# Patient Record
Sex: Female | Born: 1965 | Race: White | Hispanic: No | Smoking: Current every day smoker
Health system: Southern US, Community
[De-identification: ages and names within clinical notes are randomized; demographics above are authoritative.]

## PROBLEM LIST (undated history)

## (undated) DIAGNOSIS — E7800 Pure hypercholesterolemia, unspecified: Secondary | ICD-10-CM

## (undated) DIAGNOSIS — J189 Pneumonia, unspecified organism: Secondary | ICD-10-CM

## (undated) DIAGNOSIS — M549 Dorsalgia, unspecified: Secondary | ICD-10-CM

## (undated) DIAGNOSIS — F329 Major depressive disorder, single episode, unspecified: Secondary | ICD-10-CM

## (undated) DIAGNOSIS — F418 Other specified anxiety disorders: Secondary | ICD-10-CM

## (undated) DIAGNOSIS — J9811 Atelectasis: Secondary | ICD-10-CM

## (undated) DIAGNOSIS — G8929 Other chronic pain: Secondary | ICD-10-CM

## (undated) DIAGNOSIS — I10 Essential (primary) hypertension: Secondary | ICD-10-CM

## (undated) DIAGNOSIS — M1990 Unspecified osteoarthritis, unspecified site: Secondary | ICD-10-CM

## (undated) DIAGNOSIS — J439 Emphysema, unspecified: Secondary | ICD-10-CM

## (undated) DIAGNOSIS — J45909 Unspecified asthma, uncomplicated: Secondary | ICD-10-CM

## (undated) DIAGNOSIS — G4730 Sleep apnea, unspecified: Secondary | ICD-10-CM

## (undated) DIAGNOSIS — K219 Gastro-esophageal reflux disease without esophagitis: Secondary | ICD-10-CM

## (undated) DIAGNOSIS — G4700 Insomnia, unspecified: Secondary | ICD-10-CM

## (undated) DIAGNOSIS — R51 Headache: Secondary | ICD-10-CM

## (undated) DIAGNOSIS — F32A Depression, unspecified: Secondary | ICD-10-CM

## (undated) DIAGNOSIS — R0600 Dyspnea, unspecified: Secondary | ICD-10-CM

## (undated) DIAGNOSIS — M797 Fibromyalgia: Secondary | ICD-10-CM

## (undated) DIAGNOSIS — J449 Chronic obstructive pulmonary disease, unspecified: Secondary | ICD-10-CM

## (undated) DIAGNOSIS — Z9981 Dependence on supplemental oxygen: Secondary | ICD-10-CM

## (undated) DIAGNOSIS — I509 Heart failure, unspecified: Secondary | ICD-10-CM

## (undated) HISTORY — DX: Essential (primary) hypertension: I10

## (undated) HISTORY — DX: Other specified anxiety disorders: F41.8

## (undated) HISTORY — PX: GALLBLADDER SURGERY: SHX652

## (undated) HISTORY — PX: FOOT SURGERY: SHX648

## (undated) HISTORY — DX: Morbid (severe) obesity due to excess calories: E66.01

## (undated) HISTORY — PX: CHOLECYSTECTOMY: SHX55

## (undated) HISTORY — DX: Gastro-esophageal reflux disease without esophagitis: K21.9

## (undated) HISTORY — PX: TONSILLECTOMY: SUR1361

## (undated) HISTORY — DX: Dorsalgia, unspecified: M54.9

## (undated) HISTORY — DX: Headache: R51

## (undated) HISTORY — DX: Unspecified asthma, uncomplicated: J45.909

## (undated) HISTORY — DX: Pure hypercholesterolemia, unspecified: E78.00

## (undated) HISTORY — DX: Insomnia, unspecified: G47.00

---

## 2002-05-20 ENCOUNTER — Ambulatory Visit (HOSPITAL_BASED_OUTPATIENT_CLINIC_OR_DEPARTMENT_OTHER): Admission: RE | Admit: 2002-05-20 | Discharge: 2002-05-20 | Payer: Self-pay | Admitting: Neurosurgery

## 2006-01-15 ENCOUNTER — Ambulatory Visit: Payer: Self-pay | Admitting: Cardiology

## 2008-07-14 ENCOUNTER — Ambulatory Visit: Admission: RE | Admit: 2008-07-14 | Discharge: 2008-07-14 | Payer: Self-pay | Admitting: Neurology

## 2009-10-26 ENCOUNTER — Encounter
Admission: RE | Admit: 2009-10-26 | Discharge: 2009-10-26 | Payer: Self-pay | Admitting: Physical Medicine & Rehabilitation

## 2010-02-01 ENCOUNTER — Encounter: Admission: RE | Admit: 2010-02-01 | Discharge: 2010-02-01 | Payer: Self-pay | Admitting: *Deleted

## 2010-02-01 ENCOUNTER — Ambulatory Visit: Payer: Self-pay | Admitting: Physical Medicine & Rehabilitation

## 2010-02-18 ENCOUNTER — Emergency Department (HOSPITAL_COMMUNITY): Admission: EM | Admit: 2010-02-18 | Discharge: 2010-02-18 | Payer: Self-pay | Admitting: Emergency Medicine

## 2010-02-19 ENCOUNTER — Encounter
Admission: RE | Admit: 2010-02-19 | Discharge: 2010-02-19 | Payer: Self-pay | Admitting: Physical Medicine & Rehabilitation

## 2010-04-11 ENCOUNTER — Ambulatory Visit (HOSPITAL_COMMUNITY): Admission: RE | Admit: 2010-04-11 | Discharge: 2010-04-11 | Payer: Self-pay | Admitting: Obstetrics and Gynecology

## 2010-09-16 ENCOUNTER — Encounter: Payer: Self-pay | Admitting: Neurology

## 2011-01-07 NOTE — Procedures (Signed)
[name], [name]               ACCOUNT NO.:  [id number]   MEDICAL RECORD NO.:  [id number]          PATIENT TYPE:  OUT   LOCATION:  SLEE                          FACILITY:  [facility]   PHYSICIAN:  Kofi A. Gerilyn Pilgrim, M.D. DATE OF BIRTH:  1966-05-14   DATE OF PROCEDURE:  07/14/2008  DATE OF DISCHARGE:  07/14/2008                             SLEEP DISORDER REPORT   INDICATIONS FOR PROCEDURE:  A 45 year old lady who presents with  hypersomnia, snoring and is being evaluated at this time for obstructive  sleep apnea syndrome.   MEDICATIONS:  Requip, albuterol, Xanax, Altace, Flexeril, Daypro,  Percocet, Effexor, triamcinolone, Flonase, Zocor, Zyrtec, Aciphex and  Detrol.   EPWORTH SLEEPINESS SCALE:  1. BMI 41.   ARCHITECTURAL SUMMARY:  This is a nocturnal polysomnogram report.  The  total recording time is 378 minutes.  The sleep efficiency is 85%.  Sleep latency 10 minutes, REM latency 264 minutes.  Stage N1 15%, N2  69%, N3 9% and REM sleep 7%.   RESPIRATORY SUMMARY:  Baseline oxygen saturation 98%.  Lowest saturation  88%.  AHI 0.5.   LIMB MOVEMENT SUMMARY:  Periodic limb index 53.   ELECTROCARDIOGRAM SUMMARY:  Average heart rate 69 with no significant  dysrhythmia's observed.   IMPRESSION:  Severe periodic limb movement disorder of sleep.      Kofi A. Gerilyn Pilgrim, M.D.  Electronically Signed     KAD/MEDQ  D:  07/24/2008  T:  07/24/2008  Job:  [id number]

## 2012-11-01 ENCOUNTER — Other Ambulatory Visit: Payer: Self-pay | Admitting: Anesthesiology

## 2012-11-01 DIAGNOSIS — M47817 Spondylosis without myelopathy or radiculopathy, lumbosacral region: Secondary | ICD-10-CM

## 2012-11-01 DIAGNOSIS — M47812 Spondylosis without myelopathy or radiculopathy, cervical region: Secondary | ICD-10-CM

## 2012-11-01 DIAGNOSIS — M5137 Other intervertebral disc degeneration, lumbosacral region: Secondary | ICD-10-CM

## 2012-11-09 ENCOUNTER — Other Ambulatory Visit: Payer: Self-pay

## 2012-11-23 ENCOUNTER — Other Ambulatory Visit: Payer: Self-pay

## 2013-06-21 ENCOUNTER — Encounter (INDEPENDENT_AMBULATORY_CARE_PROVIDER_SITE_OTHER): Payer: Self-pay

## 2013-06-21 ENCOUNTER — Ambulatory Visit (INDEPENDENT_AMBULATORY_CARE_PROVIDER_SITE_OTHER): Payer: Medicaid Other | Admitting: Neurology

## 2013-06-21 ENCOUNTER — Encounter: Payer: Self-pay | Admitting: Neurology

## 2013-06-21 VITALS — BP 164/93 | HR 83 | Temp 98.2°F | Ht 65.0 in | Wt 339.0 lb

## 2013-06-21 DIAGNOSIS — M542 Cervicalgia: Secondary | ICD-10-CM | POA: Insufficient documentation

## 2013-06-21 DIAGNOSIS — G43019 Migraine without aura, intractable, without status migrainosus: Secondary | ICD-10-CM | POA: Insufficient documentation

## 2013-06-21 MED ORDER — ELETRIPTAN HYDROBROMIDE 40 MG PO TABS: 40.0000 mg | ORAL_TABLET | ORAL | Status: DC | PRN

## 2013-06-21 NOTE — Patient Instructions (Signed)
She was advised to try Relpax 40 mg for symptomatic relief of migraine and may repeat a second dose in one hour if suboptimal relief. Maximum 2 doses a day and maximum 2 days per week. Continue Topamax 200 mg daily for headache prevention and increase Zanaflex to 4 mg 1 tablet in the morning one at movement 2 tablets at night. I advised her to do neck stretching exercises and to maintain a headache diary. Check MRI scan of the brain with and without contrast an MRI scan of the cervical spine. Return for followup in 6 weeks with Larita Fife, nurse. practitioner call earlier if necessary.

## 2013-06-21 NOTE — Progress Notes (Signed)
[facility] [address] [place]. [place] [address] [phone]       OFFICE CONSULT NOTE  Ms. Aliany Fiorenza Date of Birth:  1966/01/13 Medical Record Number:  [id number]   Referring MD:  Doreen Beam  Reason for Referral:  headaches  HPI: Ms Ramberg is a 40 year Caucasian lady who is having migraine headaches most of her life. These seem to have gotten worse over the last 4 years. The headaches are stereotypical and began with a sharp pain over the left frontal region which  quickly grows to involve the left temple as well as the back of the head. This is accompanied by nausea, dizziness. She is in severe pain and cannot drive and has to rest and in fact is crying often. She in fact has to go to the emergency room for short-term relief with injections that it often comes back. She has been on Topamax 200 mg daily for headache prophylaxis the last 2 years and she has recently started Zanaflex 4 mg he tends daily for the last 4 months mainly for back pain and spasms. She has not tried amitriptyline or Inderal. The headache will last several hours to days. She states that she has not tried Imitrex or any other tip can't medications. She has about 8 headache days per month. She takes over-the-counter analgesics like Tylenol which did not help. She has been started on Percocet recently for shoulder and back pain which seems to help her headache a little. She has history of degenerative spine disease and at times has severe back pain intractable leg as well. She does have a strong family history of migraine in mother, sister and daughter. She states she had a CT scan of the head done a year ago at [facility] which was unremarkable. She has never had an MRI done. Is not able to for specific triggers for headaches but does find some relief when sleeping and lying down and not moving. Denies visual symptoms  with her headaches or focal neurological symptoms accompanying her  headaches.  ROS:   14 system review of systems is positive for fatigue, palpitations, chest pain, leg swelling, ringing in the ears, blurred vision and loss of vision, eye pain, shortness of breath, cough, wheezing, feeling cold and thirst, easy bruising, memory loss, confusion, headache, numbness, weakness, dizziness, anxiety, depression, not enough sleep, decreased energy, change in appetite, and his interest in activities, recent past, insomnia, sleepiness, snoring and restless legs. PMH:  Past Medical History  Diagnosis Date  . Headache(784.0)   . Depression with anxiety     Social History:  History   Social History  . Marital Status: Married    Spouse Name: N/A    Number of Children: 5  . Years of Education: 9th    Occupational History  . Not on file.   Social History Main Topics  . Smoking status: Current Every Day Smoker  . Smokeless tobacco: Not on file  . Alcohol Use: Yes     Comment: sometimes  . Drug Use: No  . Sexual Activity: Yes   Other Topics Concern  . Not on file   Social History Narrative  . No narrative on file    Medications:   No current outpatient prescriptions on file prior to visit.   No current facility-administered medications on file prior to visit.    Allergies:   Allergies  Allergen Reactions  . Morphine And Related   . Advera [Alitraq]  Physical Exam General: Morbidly obese middle-aged Caucasian lady, seated, in no evident distress Head: head normocephalic and atraumatic. Orohparynx benign Neck: supple with no carotid or supraclavicular bruits Cardiovascular: regular rate and rhythm, no murmurs Musculoskeletal: no deformity. Mild spasm of posterior neck muscles with tender points over the shoulder blades. Skin:  no rash/petichiae Vascular:  Normal pulses all extremities Filed Vitals:   06/21/13 1323  BP: 164/93  Pulse: 83  Temp: 98.2 F (36.8 C)    Neurologic Exam Mental Status: Awake and fully alert. Oriented to  place and time. Recent and remote memory intact. Attention span, concentration and fund of knowledge appropriate. Mood and affect appropriate.  Cranial Nerves: Fundoscopic exam reveals sharp disc margins. Pupils equal, briskly reactive to light. Extraocular movements full without nystagmus. Visual fields full to confrontation. Hearing intact. Facial sensation intact. Face, tongue, palate moves normally and symmetrically.  Motor: Normal bulk and tone. Normal strength in all tested extremity muscles. Sensory.: intact to touch and pinprick and vibratory.  Coordination: Rapid alternating movements normal in all extremities. Finger-to-nose and heel-to-shin performed accurately bilaterally. Gait and Station: Arises from chair without difficulty. Stance is normal. Gait demonstrates normal stride length and balance . Able to heel, toe and tandem walk without difficulty.  Reflexes:  Depressed and symmetric. Toes downgoing.     ASSESSMENT: 89 year lady with long-standing history of migraines with gradual worsening over the years    PLAN: I had a long discussion with the patient about her headaches, triggers, relieving factors, plan for evaluation and treatment and answered questions. She was advised to try Relpax 40 mg for symptomatic relief of migraine and may repeat a second dose in one hour if suboptimal relief. Maximum 2 doses a day and maximum 2 days per week. Continue Topamax 200 mg daily for headache prevention and increase Zanaflex to 4 mg 1 tablet in the morning one at movement 2 tablets at night. I advised her to do neck stretching exercises and to maintain a headache diary. Check MRI scan of the brain with and without contrast an MRI scan of the cervical spine. Return for followup in 6 weeks with Larita Fife, nurse. practitioner call earlier if necessary.

## 2013-06-29 ENCOUNTER — Other Ambulatory Visit: Payer: Self-pay | Admitting: Orthopaedic Surgery

## 2013-06-29 DIAGNOSIS — M7731 Calcaneal spur, right foot: Secondary | ICD-10-CM

## 2013-06-29 DIAGNOSIS — M79671 Pain in right foot: Secondary | ICD-10-CM

## 2013-07-12 ENCOUNTER — Other Ambulatory Visit: Payer: Self-pay

## 2013-08-05 ENCOUNTER — Encounter: Payer: Self-pay | Admitting: Nurse Practitioner

## 2013-08-05 ENCOUNTER — Encounter (INDEPENDENT_AMBULATORY_CARE_PROVIDER_SITE_OTHER): Payer: Self-pay

## 2013-08-05 ENCOUNTER — Ambulatory Visit (INDEPENDENT_AMBULATORY_CARE_PROVIDER_SITE_OTHER): Payer: Medicaid Other | Admitting: Nurse Practitioner

## 2013-08-05 VITALS — BP 125/80 | HR 76 | Temp 98.2°F | Ht 65.0 in | Wt 339.0 lb

## 2013-08-05 DIAGNOSIS — G43019 Migraine without aura, intractable, without status migrainosus: Secondary | ICD-10-CM

## 2013-08-05 DIAGNOSIS — R112 Nausea with vomiting, unspecified: Secondary | ICD-10-CM

## 2013-08-05 DIAGNOSIS — R519 Headache, unspecified: Secondary | ICD-10-CM

## 2013-08-05 DIAGNOSIS — R51 Headache: Secondary | ICD-10-CM

## 2013-08-05 MED ORDER — TOPIRAMATE 200 MG PO TABS: 100.0000 mg | ORAL_TABLET | Freq: Every day | ORAL | Status: DC

## 2013-08-05 MED ORDER — TRAMADOL HCL 50 MG PO TABS: 100.0000 mg | ORAL_TABLET | Freq: Four times a day (QID) | ORAL | Status: DC | PRN

## 2013-08-05 MED ORDER — TIZANIDINE HCL 6 MG PO CAPS: 6.0000 mg | ORAL_CAPSULE | Freq: Three times a day (TID) | ORAL | Status: DC

## 2013-08-05 MED ORDER — TOPIRAMATE 100 MG PO TABS: 100.0000 mg | ORAL_TABLET | Freq: Every day | ORAL | Status: DC

## 2013-08-05 NOTE — Patient Instructions (Signed)
Increase Tizanidine to 6mg  every 6 hours as headache prevention.  Do neck stretching exercises and Relaxation exercises to relieve stress.  Stop Smoking.  This is likely making your headaches worse.  Tramadol 100 mg every 6 hours as needed for moderate to severe headache.  I have ordered a CT of the head, if it is approved, someone will call to set this up.  Follow up in 3 months.

## 2013-08-05 NOTE — Progress Notes (Signed)
PATIENT: Marissa [name] DOB: 05-06-1966   REASON FOR VISIT: follow up for Migraine HISTORY FROM: patient  HISTORY OF PRESENT ILLNESS: 06/21/13 (PS): Marissa [name] is a 23 year Caucasian lady who is having migraine headaches most of her life. These seem to have gotten worse over the last 4 years. The headaches are stereotypical and began with a sharp pain over the left frontal region which quickly grows to involve the left temple as well as the back of the head. This is accompanied by nausea, dizziness. She is in severe pain and cannot drive and has to rest and in fact is crying often. She in fact has to go to the emergency room for short-term relief with injections that it often comes back. She has been on Topamax 200 mg daily for headache prophylaxis the last 2 years and she has recently started Zanaflex 4 mg she takes daily for the last 4 months mainly for back pain and spasms. She has not tried amitriptyline or Inderal. The headache will last several hours to days. She states that she has not tried Imitrex or any other acute Migraine medications. She has about 8 headache days per month. She takes over-the-counter analgesics like Tylenol which did not help. She has been started on Percocet recently for shoulder and back pain which seems to help her headache a little. She has history of degenerative spine disease and at times has severe back pain intractable leg as well. She does have a strong family history of migraine in mother, sister and daughter. She has never had an MRI done. Is not able to for specific triggers for headaches but does find some relief when sleeping and lying down and not moving. Denies visual symptoms with her headaches or focal neurological symptoms accompanying her headaches.   08/05/13 (LL): Marissa. [name] returns for revisit for headaches.  She states her headaches are worsening, she has them nearly every day, worse early in the morning, right-sided, with nausea and vomiting sometimes.   No associated aura.  She states increase in Topamax has not helped, neither has Tizanidine had any effect.  She has taken Tramadol in the past which did not help.  The only thing she states has helped was Percocet.  She states she is trying to stop smoking.  She reports a bad reaction to Relpax; with all over weakness, shortness of breath, and tightness in her chest.    ROS:  14 system review of systems is positive for  memory loss, headache, numbness, weakness, dizziness, not enough sleep, insomnia, sleepiness, and restless legs.   ALLERGIES: Allergies  Allergen Reactions  . Morphine And Related   . Advera [Alitraq]   . Relpax [Eletriptan]     Weakness, couldn't move    HOME MEDICATIONS: Outpatient Prescriptions Prior to Visit  Medication Sig Dispense Refill  . ARIPiprazole (ABILIFY) 5 MG tablet Take 5 mg by mouth daily.      . Cetirizine HCl (ZYRTEC ALLERGY) 10 MG CAPS Take 10 mg by mouth.      . escitalopram (LEXAPRO) 20 MG tablet Take 20 mg by mouth daily.      Marland Kitchen oxyCODONE-acetaminophen (PERCOCET) 10-325 MG per tablet Take 1 tablet by mouth every 4 (four) hours as needed for pain (take twice a day as needed).      Marland Kitchen tiZANidine (ZANAFLEX) 4 MG capsule Take 4 mg by mouth 3 (three) times daily.      Marland Kitchen topiramate (TOPAMAX) 200 MG tablet Take 200 mg by mouth  daily.      . eletriptan (RELPAX) 40 MG tablet Take 1 tablet (40 mg total) by mouth as needed for migraine. One tablet by mouth at onset of headache. May repeat in 2 hours if headache persists or recurs.  10 tablet  0   No facility-administered medications prior to visit.    PAST MEDICAL HISTORY: Past Medical History  Diagnosis Date  . Headache(784.0)   . Depression with anxiety     PAST SURGICAL HISTORY: Past Surgical History  Procedure Laterality Date  . Cesarean section    . Gallbladder surgery    . Foot surgery      FAMILY HISTORY: Family History  Problem Relation Age of Onset  . Congestive Heart Failure Mother       SOCIAL HISTORY: History   Social History  . Marital Status: Divorced    Spouse Name: N/A    Number of Children: 5  . Years of Education: 9th    Occupational History  .  Other    n/a   Social History Main Topics  . Smoking status: Current Every Day Smoker  . Smokeless tobacco: Never Used  . Alcohol Use: Yes     Comment: sometimes  . Drug Use: No  . Sexual Activity: Yes   Other Topics Concern  . Not on file   Social History Narrative   Patient lives at home with family.   Caffeine Use: 1 cup daily   PHYSICAL EXAM  Filed Vitals:   08/05/13 1513  BP: 125/80  Pulse: 76  Temp: 98.2 F (36.8 C)  TempSrc: Oral  Height: 5\' 5"  (1.651 m)  Weight: 339 lb (153.769 kg)   Body mass index is 56.41 kg/(m^2).  General: Morbidly obese middle-aged Caucasian lady, seated, in no evident distress, strong smell of cigarettes.  Head: head normocephalic and atraumatic. Orohparynx benign  Neck: supple with no carotid or supraclavicular bruits  Cardiovascular: regular rate and rhythm, no murmurs  Musculoskeletal: no deformity. Mild spasm of posterior neck muscles with tender points over the shoulder blades.  Skin: no rash/petichiae  Vascular: Normal pulses all extremities  Neurological examination  Mental Status: Awake and fully alert. Oriented to place and time. Recent and remote memory intact. Attention span, concentration and fund of knowledge appropriate. Mood and affect appropriate.  Cranial Nerves: Fundoscopic exam reveals sharp disc margins. Pupils equal, briskly reactive to light. Extraocular movements full without nystagmus. Visual fields full to confrontation. Hearing intact. Facial sensation intact. Face, tongue, palate moves normally and symmetrically.  Motor: Normal bulk and tone. Normal strength in all tested extremity muscles.  Sensor: intact to touch and pinprick and vibratory.  Coordination: Rapid alternating movements normal in all extremities. Finger-to-nose and  heel-to-shin performed accurately bilaterally.  Gait and Station: Arises from chair with mild difficulty. Stance is wide based. Gait demonstrates short stride length. Unable to heel, toe and tandem walk without difficulty. Reflexes: Depressed and symmetric.   DIAGNOSTIC DATA (LABS, IMAGING, TESTING) - I reviewed patient records, labs, notes, testing and imaging myself where available.  ASSESSMENT AND PLAN 49 year lady with long-standing history of migraines without aura with progressive worsening.  Occasional nausea and vomiting associated with headaches.  PLAN:  I had a long discussion with the patient about her headaches, triggers, relieving factors, plan for evaluation and treatment and answered questions.  We do not prescribe narcotics for Migraines. Reduce Topamax to 100 mg daily for headache prevention, with plan to wean off -- not beneficial. Increase Tizanidine to 6mg   every 6 hours as headache prevention. Do neck stretching exercises and Relaxation exercises to relieve stress. Stop Smoking.  This is likely making your headaches worse. I have ordered a CT of the head to rule out tumor/mass. Follow up in 3 months.  Orders Placed This Encounter  Procedures  . CT Head Wo Contrast   Meds ordered this encounter  Medications  . tizanidine (ZANAFLEX) 6 MG capsule    Sig: Take 1 capsule (6 mg total) by mouth 3 (three) times daily.    Dispense:  120 capsule    Refill:  5    Order Specific Question:  Supervising Provider    Answer:  Pearlean Brownie, PRAMOD S [[id number]]  . topiramate (TOPAMAX) 100 MG tablet    Sig: Take 0.5 tablets (100 mg total) by mouth daily.    Order Specific Question:  Supervising Provider    Answer:  Micki Riley [[id number]]   Return in about 3 months (around 11/03/2013).  Ronal Fear, MSN, NP-C 08/05/2013, 4:19 PM [facility] [address], [address] [place], [place] [address] [phone]  Note: This document was prepared with digital dictation and  possible smart phrase technology. Any transcriptional errors that result from this process are unintentional.

## 2013-08-16 ENCOUNTER — Other Ambulatory Visit: Payer: Self-pay

## 2013-09-01 ENCOUNTER — Other Ambulatory Visit: Payer: Self-pay

## 2013-09-01 ENCOUNTER — Ambulatory Visit
Admission: RE | Admit: 2013-09-01 | Discharge: 2013-09-01 | Disposition: A | Discharge status: Home / Self Care | Payer: Medicaid Other | Source: Ambulatory Visit | Attending: Nurse Practitioner | Admitting: Nurse Practitioner

## 2013-09-01 DIAGNOSIS — G43019 Migraine without aura, intractable, without status migrainosus: Secondary | ICD-10-CM

## 2013-09-01 DIAGNOSIS — R112 Nausea with vomiting, unspecified: Secondary | ICD-10-CM

## 2013-09-01 DIAGNOSIS — R51 Headache: Secondary | ICD-10-CM

## 2013-09-01 DIAGNOSIS — R519 Headache, unspecified: Secondary | ICD-10-CM

## 2013-09-15 ENCOUNTER — Ambulatory Visit: Payer: Medicaid Other | Admitting: Podiatry

## 2013-09-19 ENCOUNTER — Ambulatory Visit: Payer: Medicaid Other | Admitting: Podiatry

## 2013-09-27 ENCOUNTER — Encounter (HOSPITAL_COMMUNITY): Payer: Self-pay | Admitting: Emergency Medicine

## 2013-09-27 ENCOUNTER — Emergency Department (HOSPITAL_COMMUNITY): Payer: Medicaid Other

## 2013-09-27 ENCOUNTER — Emergency Department (HOSPITAL_COMMUNITY)
Admission: EM | Admit: 2013-09-27 | Discharge: 2013-09-27 | Disposition: A | Discharge status: Home / Self Care | Payer: Medicaid Other | Attending: Emergency Medicine | Admitting: Emergency Medicine

## 2013-09-27 DIAGNOSIS — F17200 Nicotine dependence, unspecified, uncomplicated: Secondary | ICD-10-CM | POA: Insufficient documentation

## 2013-09-27 DIAGNOSIS — R209 Unspecified disturbances of skin sensation: Secondary | ICD-10-CM | POA: Insufficient documentation

## 2013-09-27 DIAGNOSIS — M79609 Pain in unspecified limb: Secondary | ICD-10-CM | POA: Insufficient documentation

## 2013-09-27 DIAGNOSIS — F341 Dysthymic disorder: Secondary | ICD-10-CM | POA: Insufficient documentation

## 2013-09-27 DIAGNOSIS — J40 Bronchitis, not specified as acute or chronic: Secondary | ICD-10-CM

## 2013-09-27 DIAGNOSIS — M5416 Radiculopathy, lumbar region: Secondary | ICD-10-CM

## 2013-09-27 DIAGNOSIS — IMO0002 Reserved for concepts with insufficient information to code with codable children: Secondary | ICD-10-CM | POA: Insufficient documentation

## 2013-09-27 DIAGNOSIS — G8929 Other chronic pain: Secondary | ICD-10-CM | POA: Insufficient documentation

## 2013-09-27 DIAGNOSIS — Z79899 Other long term (current) drug therapy: Secondary | ICD-10-CM | POA: Insufficient documentation

## 2013-09-27 DIAGNOSIS — J209 Acute bronchitis, unspecified: Secondary | ICD-10-CM | POA: Insufficient documentation

## 2013-09-27 HISTORY — DX: Other chronic pain: G89.29

## 2013-09-27 HISTORY — DX: Dorsalgia, unspecified: M54.9

## 2013-09-27 LAB — BASIC METABOLIC PANEL
BUN: 8 mg/dL (ref 6–23)
CO2: 28 meq/L (ref 19–32)
Calcium: 9 mg/dL (ref 8.4–10.5)
Chloride: 101 mEq/L (ref 96–112)
Creatinine, Ser: 0.7 mg/dL (ref 0.50–1.10)
GLUCOSE: 112 mg/dL — AB (ref 70–99)
Potassium: 4.3 mEq/L (ref 3.7–5.3)
Sodium: 140 mEq/L (ref 137–147)

## 2013-09-27 LAB — CBC WITH DIFFERENTIAL/PLATELET
BASOS ABS: 0.1 10*3/uL (ref 0.0–0.1)
Basophils Relative: 1 % (ref 0–1)
EOS PCT: 2 % (ref 0–5)
Eosinophils Absolute: 0.2 10*3/uL (ref 0.0–0.7)
HEMATOCRIT: 43.7 % (ref 36.0–46.0)
HEMOGLOBIN: 14.3 g/dL (ref 12.0–15.0)
LYMPHS ABS: 1.8 10*3/uL (ref 0.7–4.0)
Lymphocytes Relative: 21 % (ref 12–46)
MCH: 33.4 pg (ref 26.0–34.0)
MCHC: 32.7 g/dL (ref 30.0–36.0)
MCV: 102.1 fL — AB (ref 78.0–100.0)
Monocytes Absolute: 0.5 10*3/uL (ref 0.1–1.0)
Monocytes Relative: 6 % (ref 3–12)
NEUTROS ABS: 6.2 10*3/uL (ref 1.7–7.7)
Neutrophils Relative %: 71 % (ref 43–77)
Platelets: 202 10*3/uL (ref 150–400)
RBC: 4.28 MIL/uL (ref 3.87–5.11)
RDW: 12.6 % (ref 11.5–15.5)
WBC: 8.8 10*3/uL (ref 4.0–10.5)

## 2013-09-27 LAB — PRO B NATRIURETIC PEPTIDE: PRO B NATRI PEPTIDE: 516.5 pg/mL — AB (ref 0–125)

## 2013-09-27 LAB — TROPONIN I: Troponin I: <0.3 ng/mL (ref <0.30)

## 2013-09-27 MED ORDER — OXYCODONE-ACETAMINOPHEN 5-325 MG PO TABS: 2.0000 | ORAL_TABLET | ORAL | Status: DC | PRN

## 2013-09-27 MED ORDER — PREDNISONE 20 MG PO TABS: ORAL_TABLET | ORAL | Status: DC

## 2013-09-27 MED ORDER — AZITHROMYCIN 250 MG PO TABS: 250.0000 mg | ORAL_TABLET | Freq: Every day | ORAL | Status: DC

## 2013-09-27 MED ORDER — FENTANYL CITRATE 0.05 MG/ML IJ SOLN: 100.0000 ug | Freq: Once | INTRAMUSCULAR | Status: DC

## 2013-09-27 MED ORDER — ALBUTEROL SULFATE HFA 108 (90 BASE) MCG/ACT IN AERS: 2.0000 | INHALATION_SPRAY | RESPIRATORY_TRACT | Status: DC | PRN

## 2013-09-27 NOTE — ED Notes (Signed)
Pt c/o pain in lower back radiating down r leg since Jan 29th.  Reports cough and sob with exertion x 2 days.  Denies chest pain.

## 2013-09-27 NOTE — ED Provider Notes (Signed)
CSN: [id number]     Arrival date & time 09/27/13  1507 History   This chart was scribed for Gilda Crease, MD, by Yevette Edwards, ED Scribe. This patient was seen in room [facility] and the patient's care was started at 3:38 PM.  First MD Initiated Contact with Patient 09/27/13 1530     Chief Complaint  Patient presents with  . Back Pain    The history is provided by the patient. No language interpreter was used.   HPI Comments: Marissa [name] is a 48 y.o. female, with a h/o chronic back pain, who presents to the Emergency Department complaining of  lower back pain which radiates down her leg into her right foot. She reports her numbness to her right foot stating this is a new symptom. The pt has finished her prescription of percocet, and she cannot have them refilled for three more days. The pt has experienced similar symptoms of back pain for multiple years; her last MRI was in 2006, 9 years ago. The pt also complains of SOB, congestion, and a cough; she reports she cannot cough normally due to back and leg pain. Marissa [name] is a current smoker.   Past Medical History  Diagnosis Date  . Headache(784.0)   . Depression with anxiety   . Chronic back pain    Past Surgical History  Procedure Laterality Date  . Cesarean section    . Gallbladder surgery    . Foot surgery    . Cholecystectomy     Family History  Problem Relation Age of Onset  . Congestive Heart Failure Mother    History  Substance Use Topics  . Smoking status: Current Every Day Smoker -- 1.00 packs/day for 35 years    Types: Cigarettes  . Smokeless tobacco: Never Used  . Alcohol Use: No   OB History   Grav Para Term Preterm Abortions TAB SAB Ect Mult Living   5 5 5       5      Review of Systems  HENT: Positive for congestion.   Respiratory: Positive for cough and shortness of breath.   All other systems reviewed and are negative.   Allergies  Morphine and related; Advera; Dilaudid; and Relpax  Home  Medications   Current Outpatient Rx  Name  Route  Sig  Dispense  Refill  . ARIPiprazole (ABILIFY) 5 MG tablet   Oral   Take 5 mg by mouth daily.         . Cetirizine HCl (ZYRTEC ALLERGY) 10 MG CAPS   Oral   Take 10 mg by mouth.         . escitalopram (LEXAPRO) 20 MG tablet   Oral   Take 20 mg by mouth daily.         Marland Kitchen oxyCODONE-acetaminophen (PERCOCET) 10-325 MG per tablet   Oral   Take 1 tablet by mouth every 4 (four) hours as needed for pain (take twice a day as needed).         . tizanidine (ZANAFLEX) 6 MG capsule   Oral   Take 1 capsule (6 mg total) by mouth 3 (three) times daily.   120 capsule   5   . topiramate (TOPAMAX) 100 MG tablet   Oral   Take 1 tablet (100 mg total) by mouth daily.          Triage Vitals: BP 151/99  Pulse 78  Temp(Src) 97.8 F (36.6 C) (Oral)  Resp 22  Ht 5\' 5"  (  1.651 m)  Wt 332 lb (150.594 kg)  BMI 55.25 kg/m2  SpO2 97%  LMP 09/13/2013  Physical Exam  Nursing note and vitals reviewed. Constitutional: She is oriented to person, place, and time. She appears well-developed and well-nourished. No distress.  HENT:  Head: Normocephalic and atraumatic.  Right Ear: Hearing normal.  Left Ear: Hearing normal.  Nose: Nose normal.  Mouth/Throat: Oropharynx is clear and moist and mucous membranes are normal.  Eyes: Conjunctivae and EOM are normal. Pupils are equal, round, and reactive to light.  Neck: Normal range of motion. Neck supple.  Cardiovascular: Regular rhythm, S1 normal and S2 normal.  Exam reveals no gallop and no friction rub.   No murmur heard. Pulmonary/Chest: Effort normal. No respiratory distress. She exhibits no tenderness.  Crackles at the base of both lungs.   Abdominal: Soft. Normal appearance and bowel sounds are normal. There is no hepatosplenomegaly. There is no tenderness. There is no rebound, no guarding, no tenderness at McBurney's point and negative Murphy's sign. No hernia.  Musculoskeletal: Normal range  of motion.  Neurological: She is alert and oriented to person, place, and time. She has normal strength. No cranial nerve deficit or sensory deficit. Coordination normal. GCS eye subscore is 4. GCS verbal subscore is 5. GCS motor subscore is 6.  Skin: Skin is warm, dry and intact. No rash noted. No cyanosis.  Psychiatric: She has a normal mood and affect. Her speech is normal and behavior is normal. Thought content normal.    ED Course  Procedures (including critical care time)  DIAGNOSTIC STUDIES: Oxygen Saturation is 97% on room air, normal by my interpretation.    COORDINATION OF CARE:  3:43 PM- Discussed treatment plan with patient, and the patient agreed to the plan.   Labs Review Labs Reviewed - No data to display Imaging Review No results found.  EKG Interpretation   None       Date: 09/27/2013  Rate: 73  Rhythm: normal sinus rhythm  QRS Axis: normal  Intervals: normal  ST/T Wave abnormalities: normal  Conduction Disutrbances:none  Narrative Interpretation:   Old EKG Reviewed: unchanged     MDM  Diagnosis: 1. Bronchitis 2. Lumbar radiculopathy  Patient presents to ER with complaints of low back pain. Patient has had chronic low back pain for some time. She reports that she has had worsening pain recently and had to take increased amounts of her Percocet, has now run out and cannot get anymore for 3 days. Patient reports pain radiating down the hallway to the foot on the right side. She has been diagnosed with "bulging discs" in the past. She appears to be in distress secondary to pain, but she has normal neurologic function here in the ER. No emergent imaging is necessary.  She also complains of cough, chest congestion. She has some slight crackles on examination, but there is no evidence of congestive heart failure. In retrospect, these were more consistent with upper airway congestion, because repeat examination reveals crackles are no longer present. Patient does  have a productive cough here in ER.  She'll be treated for acute bronchitis. She'll have prednisone added for the bronchitis as well as for the lumbar radiculopathy. Percocet as needed for pain. Patient is to followup with her primary care physician, Doctor VYAS as soon as possible for further management.  I personally performed the services described in this documentation, which was scribed in my presence. The recorded information has been reviewed and is accurate.  Gilda Crease, MD 09/27/13 [phone]

## 2013-09-27 NOTE — Discharge Instructions (Signed)
Bronchitis Bronchitis is inflammation of the airways that extend from the windpipe into the lungs (bronchi). The inflammation often causes mucus to develop, which leads to a cough. If the inflammation becomes severe, it may cause shortness of breath. CAUSES  Bronchitis may be caused by:   Viral infections.   Bacteria.   Cigarette smoke.   Allergens, pollutants, and other irritants.  SIGNS AND SYMPTOMS  The most common symptom of bronchitis is a frequent cough that produces mucus. Other symptoms include:  Fever.   Body aches.   Chest congestion.   Chills.   Shortness of breath.   Sore throat.  DIAGNOSIS  Bronchitis is usually diagnosed through a medical history and physical exam. Tests, such as chest X-rays, are sometimes done to rule out other conditions.  TREATMENT  You may need to avoid contact with whatever caused the problem (smoking, for example). Medicines are sometimes needed. These may include:  Antibiotics. These may be prescribed if the condition is caused by bacteria.  Cough suppressants. These may be prescribed for relief of cough symptoms.   Inhaled medicines. These may be prescribed to help open your airways and make it easier for you to breathe.   Steroid medicines. These may be prescribed for those with recurrent (chronic) bronchitis. HOME CARE INSTRUCTIONS  Get plenty of rest.   Drink enough fluids to keep your urine clear or pale yellow (unless you have a medical condition that requires fluid restriction). Increasing fluids may help thin your secretions and will prevent dehydration.   Only take over-the-counter or prescription medicines as directed by your health care provider.  Only take antibiotics as directed. Make sure you finish them even if you start to feel better.  Avoid secondhand smoke, irritating chemicals, and strong fumes. These will make bronchitis worse. If you are a smoker, quit smoking. Consider using nicotine gum or  skin patches to help control withdrawal symptoms. Quitting smoking will help your lungs heal faster.   Put a cool-mist humidifier in your bedroom at night to moisten the air. This may help loosen mucus. Change the water in the humidifier daily. You can also run the hot water in your shower and sit in the bathroom with the door closed for 5 10 minutes.   Follow up with your health care provider as directed.   Wash your hands frequently to avoid catching bronchitis again or spreading an infection to others.  SEEK MEDICAL CARE IF: Your symptoms do not improve after 1 week of treatment.  SEEK IMMEDIATE MEDICAL CARE IF:  Your fever increases.  You have chills.   You have chest pain.   You have worsening shortness of breath.   You have bloody sputum.  You faint.  You have lightheadedness.  You have a severe headache.   You vomit repeatedly. MAKE SURE YOU:   Understand these instructions.  Will watch your condition.  Will get help right away if you are not doing well or get worse. Document Released: 08/11/2005 Document Revised: 06/01/2013 Document Reviewed: 04/05/2013 [facility] Patient Information 2014 [place], [place].  Lumbosacral Radiculopathy Lumbosacral radiculopathy is a pinched nerve or nerves in the low back (lumbosacral area). When this happens you may have weakness in your legs and may not be able to stand on your toes. You may have pain going down into your legs. There may be difficulties with walking normally. There are many causes of this problem. Sometimes this may happen from an injury, or simply from arthritis or boney problems. It may also  be caused by other illnesses such as diabetes. If there is no improvement after treatment, further studies may be done to find the exact cause. DIAGNOSIS  X-rays may be needed if the problems become long standing. Electromyograms may be done. This study is one in which the working of nerves and muscles is studied. HOME  CARE INSTRUCTIONS   Applications of ice packs may be helpful. Ice can be used in a plastic bag with a towel around it to prevent frostbite to skin. This may be used every 2 hours for 20 to 30 minutes, or as needed, while awake, or as directed by your caregiver.  Only take over-the-counter or prescription medicines for pain, discomfort, or fever as directed by your caregiver.  If physical therapy was prescribed, follow your caregiver's directions. SEEK IMMEDIATE MEDICAL CARE IF:   You have pain not controlled with medications.  You seem to be getting worse rather than better.  You develop increasing weakness in your legs.  You develop loss of bowel or bladder control.  You have difficulty with walking or balance, or develop clumsiness in the use of your legs.  You have a fever. MAKE SURE YOU:   Understand these instructions.  Will watch your condition.  Will get help right away if you are not doing well or get worse. Document Released: 08/11/2005 Document Revised: 11/03/2011 Document Reviewed: 03/31/2008 [facility] Patient Information 2014 [place].

## 2013-09-27 NOTE — ED Notes (Signed)
Patient c/o severe lower back pain.Per patient has three bulging discs in lower back.  Patient reports out of pain medication -Percocet 10/325mg . Patient states unable to get pain medication filled until the 09/30/13. [facility] injection of dilaudid in which she had an allergic reaction. While being triaged patient also states "I think I have fluid built up on my lungs." Reports some shortness of breath. Patient states "When I cough it sounds like a gurgling sound."

## 2013-09-27 NOTE — ED Notes (Signed)
Unable to obtain IV access, Dr. Blinda LeatherwoodPollina aware.

## 2013-10-05 ENCOUNTER — Ambulatory Visit: Payer: Medicaid Other | Admitting: Podiatry

## 2013-10-27 ENCOUNTER — Other Ambulatory Visit: Payer: Self-pay | Admitting: Orthopaedic Surgery

## 2013-10-27 DIAGNOSIS — M545 Low back pain: Principal | ICD-10-CM

## 2013-10-27 DIAGNOSIS — G8929 Other chronic pain: Secondary | ICD-10-CM

## 2013-11-03 ENCOUNTER — Ambulatory Visit: Payer: Medicaid Other | Admitting: Podiatry

## 2013-11-03 ENCOUNTER — Other Ambulatory Visit: Payer: Medicaid Other

## 2013-11-10 ENCOUNTER — Ambulatory Visit
Admission: RE | Admit: 2013-11-10 | Discharge: 2013-11-10 | Disposition: A | Discharge status: Home / Self Care | Payer: Medicaid Other | Source: Ambulatory Visit | Attending: Orthopaedic Surgery | Admitting: Orthopaedic Surgery

## 2013-11-10 DIAGNOSIS — M545 Low back pain: Principal | ICD-10-CM

## 2013-11-10 DIAGNOSIS — G8929 Other chronic pain: Secondary | ICD-10-CM

## 2013-12-14 ENCOUNTER — Telehealth: Payer: Self-pay | Admitting: Nurse Practitioner

## 2013-12-14 ENCOUNTER — Ambulatory Visit: Payer: Medicaid Other | Admitting: Nurse Practitioner

## 2013-12-14 NOTE — Telephone Encounter (Signed)
Patient was no show for today's office appointment.  

## 2014-06-26 ENCOUNTER — Encounter (HOSPITAL_COMMUNITY): Payer: Self-pay | Admitting: Emergency Medicine

## 2016-07-21 ENCOUNTER — Other Ambulatory Visit: Payer: Self-pay | Admitting: Neurology

## 2016-07-21 DIAGNOSIS — M533 Sacrococcygeal disorders, not elsewhere classified: Secondary | ICD-10-CM

## 2016-07-31 ENCOUNTER — Ambulatory Visit
Admission: RE | Admit: 2016-07-31 | Discharge: 2016-07-31 | Disposition: A | Discharge status: Home / Self Care | Payer: Medicaid Other | Source: Ambulatory Visit | Attending: Neurology | Admitting: Neurology

## 2016-07-31 DIAGNOSIS — M533 Sacrococcygeal disorders, not elsewhere classified: Secondary | ICD-10-CM

## 2016-07-31 MED ORDER — IOPAMIDOL (ISOVUE-M 200) INJECTION 41%
1.0000 mL | Freq: Once | INTRAMUSCULAR | Status: AC
  Administered 2016-07-31: 1 mL via INTRA_ARTICULAR

## 2016-07-31 MED ORDER — METHYLPREDNISOLONE ACETATE 40 MG/ML INJ SUSP (RADIOLOG
120.0000 mg | Freq: Once | INTRAMUSCULAR | Status: AC
  Administered 2016-07-31: 120 mg via INTRA_ARTICULAR

## 2016-07-31 NOTE — Discharge Instructions (Signed)

## 2016-11-25 ENCOUNTER — Ambulatory Visit (INDEPENDENT_AMBULATORY_CARE_PROVIDER_SITE_OTHER): Payer: Medicaid Other | Admitting: Cardiology

## 2016-11-25 ENCOUNTER — Encounter: Payer: Self-pay | Admitting: Cardiology

## 2016-11-25 VITALS — BP 136/77 | HR 84 | Ht 66.0 in | Wt 346.0 lb

## 2016-11-25 DIAGNOSIS — R0789 Other chest pain: Secondary | ICD-10-CM | POA: Diagnosis not present

## 2016-11-25 DIAGNOSIS — R002 Palpitations: Secondary | ICD-10-CM | POA: Diagnosis not present

## 2016-11-25 MED ORDER — FUROSEMIDE 20 MG PO TABS: 20.0000 mg | ORAL_TABLET | Freq: Every day | ORAL | 1 refills | Status: DC | PRN

## 2016-11-25 NOTE — Patient Instructions (Signed)
Your physician wants you to follow-up in: 6 MONTHS WITH DR. BRANCH You will receive a reminder letter in the mail two months in advance. If you don't receive a letter, please call our office to schedule the follow-up appointment.  Your physician has recommended you make the following change in your medication:   START LASIX 20 MG DAILY AS NEEDED FOR SWELLING  Thank you for choosing [facility]!!     

## 2016-11-25 NOTE — Progress Notes (Signed)
Clinical Summary Marissa [name] is a 51 y.o.female seen as new patient, she is referred by Dr Sherril Croon for the following medical problems.    1. Chest pain - chest pain started about 2-3 weeks. Worst with breathing, midchest. Severe pain. No other associated symptoms.  - can be worst with walking up stairs at her apartment. No relation to food. - admit to [facility] 10/2016 with symptoms.  - CT PE negative.  -10/2016 echo LVEF 60-65%, normal diastolic function, normal RV  - chest pain has since resolved since discharge.    2. Palpitations - occasional symptoms  - can occur at rest or with exertion - reports recent family stress as main trigger - no recent coffee, tea x 1-2 glasses, occasional sodas. No energy drinks. Beer 2 glasses day.      Past Medical History:  Diagnosis Date  . Asthma   . Back pain   . Chronic back pain   . Depression with anxiety   . GERD (gastroesophageal reflux disease)   . Headache(784.0)   . Hypercholesterolemia   . Hypertension   . Insomnia   . Morbid obesity (HCC)      Allergies  Allergen Reactions  . Morphine And Related   . Advera [Alitraq]   . Dilaudid [Hydromorphone Hcl] Itching and Other (See Comments)    Tremors, headache   . Relpax [Eletriptan]     Weakness, couldn't move     Current Outpatient Prescriptions  Medication Sig Dispense Refill  . albuterol (PROVENTIL HFA;VENTOLIN HFA) 108 (90 BASE) MCG/ACT inhaler Inhale 2 puffs into the lungs every 4 (four) hours as needed for wheezing or shortness of breath. 1 Inhaler 2  . ARIPiprazole (ABILIFY) 5 MG tablet Take 5 mg by mouth daily.    Marland Kitchen azithromycin (ZITHROMAX) 250 MG tablet Take 1 tablet (250 mg total) by mouth daily. Take first 2 tablets together, then 1 every day until finished. 6 tablet 0  . Cetirizine HCl (ZYRTEC ALLERGY) 10 MG CAPS Take 10 mg by mouth.    . escitalopram (LEXAPRO) 20 MG tablet Take 20 mg by mouth daily.    Marland Kitchen oxyCODONE-acetaminophen (PERCOCET) 10-325 MG per  tablet Take 1 tablet by mouth every 4 (four) hours as needed for pain (take twice a day as needed).    Marland Kitchen oxyCODONE-acetaminophen (PERCOCET) 5-325 MG per tablet Take 2 tablets by mouth every 4 (four) hours as needed. 20 tablet 0  . predniSONE (DELTASONE) 20 MG tablet 3 tabs po daily x 3 days, then 2 tabs x 3 days, then 1.5 tabs x 3 days, then 1 tab x 3 days, then 0.5 tabs x 3 days 27 tablet 0  . tizanidine (ZANAFLEX) 6 MG capsule Take 1 capsule (6 mg total) by mouth 3 (three) times daily. 120 capsule 5  . topiramate (TOPAMAX) 100 MG tablet Take 1 tablet (100 mg total) by mouth daily.     No current facility-administered medications for this visit.      Past Surgical History:  Procedure Laterality Date  . CESAREAN SECTION    . CHOLECYSTECTOMY    . FOOT SURGERY    . GALLBLADDER SURGERY    . TONSILLECTOMY       Allergies  Allergen Reactions  . Morphine And Related   . Advera [Alitraq]   . Dilaudid [Hydromorphone Hcl] Itching and Other (See Comments)    Tremors, headache   . Relpax [Eletriptan]     Weakness, couldn't move      Family  History  Problem Relation Age of Onset  . Congestive Heart Failure Mother   . Hypertension Mother      Social History Marissa [name] reports that she has been smoking Cigarettes.  She has a 35.00 pack-year smoking history. She has never used smokeless tobacco. Marissa [name] reports that she does not drink alcohol.   Review of Systems CONSTITUTIONAL: No weight loss, fever, chills, weakness or fatigue.  HEENT: Eyes: No visual loss, blurred vision, double vision or yellow sclerae.No hearing loss, sneezing, congestion, runny nose or sore throat.  SKIN: No rash or itching.  CARDIOVASCULAR: per hpi RESPIRATORY: No shortness of breath, cough or sputum.  GASTROINTESTINAL: No anorexia, nausea, vomiting or diarrhea. No abdominal pain or blood.  GENITOURINARY: No burning on urination, no polyuria NEUROLOGICAL: No headache, dizziness, syncope, paralysis, ataxia,  numbness or tingling in the extremities. No change in bowel or bladder control.  MUSCULOSKELETAL: No muscle, back pain, joint pain or stiffness.  LYMPHATICS: No enlarged nodes. No history of splenectomy.  PSYCHIATRIC: No history of depression or anxiety.  ENDOCRINOLOGIC: No reports of sweating, cold or heat intolerance. No polyuria or polydipsia.  Marland Kitchen   Physical Examination Vitals:   11/25/16 1342  BP: 136/77  Pulse: 84   Vitals:   11/25/16 1342  Weight: (!) 346 lb (156.9 kg)  Height:  (1.676 m)    Gen: resting comfortably, no acute distress HEENT: no scleral icterus, pupils equal round and reactive, no palptable cervical adenopathy,  CV: RRR, no m/r/g, no jvd Resp: Clear to auscultation bilaterally GI: abdomen is soft, non-tender, non-distended, normal bowel sounds, no hepatosplenomegaly MSK: extremities are warm, no edema.  Skin: warm, no rash Neuro:  no focal deficits Psych: appropriate affect    Assessment and Plan  1. Chest pain - atypical chest pain that has resolved - no further workup at this time. Continue to monitor symptoms.   2. Palpitations - primarily brought on with stress, fairly mild symptoms. EKG in clinic shows SR.  - continue to monitor at this time. If progresses can consider home monitor   F/u 6 months   Antoine Poche, M.D

## 2016-12-24 ENCOUNTER — Institutional Professional Consult (permissible substitution): Payer: Medicaid Other | Admitting: Pulmonary Disease

## 2017-01-27 ENCOUNTER — Encounter: Payer: Self-pay | Admitting: Pulmonary Disease

## 2017-01-27 ENCOUNTER — Ambulatory Visit (INDEPENDENT_AMBULATORY_CARE_PROVIDER_SITE_OTHER): Payer: Medicaid Other | Admitting: Pulmonary Disease

## 2017-01-27 ENCOUNTER — Other Ambulatory Visit (INDEPENDENT_AMBULATORY_CARE_PROVIDER_SITE_OTHER): Payer: Medicaid Other

## 2017-01-27 VITALS — BP 138/72 | HR 90 | Ht 66.0 in | Wt 342.2 lb

## 2017-01-27 DIAGNOSIS — G4719 Other hypersomnia: Secondary | ICD-10-CM

## 2017-01-27 DIAGNOSIS — R0602 Shortness of breath: Secondary | ICD-10-CM

## 2017-01-27 LAB — CBC WITH DIFFERENTIAL/PLATELET
BASOS ABS: 0.1 10*3/uL (ref 0.0–0.1)
Basophils Relative: 1.1 % (ref 0.0–3.0)
EOS ABS: 0.1 10*3/uL (ref 0.0–0.7)
Eosinophils Relative: 1.2 % (ref 0.0–5.0)
HEMATOCRIT: 41.9 % (ref 36.0–46.0)
Hemoglobin: 14.2 g/dL (ref 12.0–15.0)
LYMPHS PCT: 30.3 % (ref 12.0–46.0)
Lymphs Abs: 3.1 10*3/uL (ref 0.7–4.0)
MCHC: 33.8 g/dL (ref 30.0–36.0)
MCV: 97 fl (ref 78.0–100.0)
MONOS PCT: 7.1 % (ref 3.0–12.0)
Monocytes Absolute: 0.7 10*3/uL (ref 0.1–1.0)
NEUTROS ABS: 6.2 10*3/uL (ref 1.4–7.7)
NEUTROS PCT: 60.3 % (ref 43.0–77.0)
PLATELETS: 310 10*3/uL (ref 150.0–400.0)
RBC: 4.32 Mil/uL (ref 3.87–5.11)
RDW: 13.4 % (ref 11.5–15.5)
WBC: 10.3 10*3/uL (ref 4.0–10.5)

## 2017-01-27 LAB — NITRIC OXIDE: NITRIC OXIDE: 6

## 2017-01-27 NOTE — Patient Instructions (Addendum)
We schedule you for pulmonary function tests Schedule you for a split-night sleep study Continue using the BiPAP, inhalers and supplemental oxygen Check A1AT level, CBC with diff, blood allergy profile. Continue working on smoking cessation  Return to clinic in 3 months.

## 2017-01-27 NOTE — Progress Notes (Signed)
Marissa [name]    [id number]    [birth year]  Primary Care Physician:Vyas, Angelina Pih, MD  Referring Physician: Ignatius Specking, MD [address] [place], [place] [address]  Chief complaint:  Consult for evaluation of COPD  HPI: 51 year old with diagnosis of COPD. She was hospitalized at [facility] in March 2018 for chest pain. She had a CT [place]uss [place] at that time which was negative for PE. Atypical chest pain has since resolved. She was hospitalized again in April 2018 for CHF, COPD exacerbation. She was discharged on 3 L oxygen at night which she is using intermittently. She was diagnosed with COPD about 5 years ago and is using Symbicort and albuterol. She was also told she has asthma.  She is still complains of dyspnea with exertion, occasional cough with mucus production and no wheezing.  Pets:No pets, no birds Occupation: Disabled currently Exposures:Mold exposure recently (she moved from apt 3 months ago) , no exposure to asbestos, dust, silica. No jaccuzi, hot tub.  Smoking history: 30 pack year history. Active smoker.  Outpatient Encounter Prescriptions as of 01/27/2017  Medication Sig  . acetaminophen (TYLENOL) 500 MG tablet Take 1,500 mg by mouth every 8 (eight) hours as needed for mild pain or moderate pain.  Marland Kitchen albuterol (PROVENTIL HFA;VENTOLIN HFA) 108 (90 BASE) MCG/ACT inhaler Inhale 2 puffs into the lungs every 4 (four) hours as needed for wheezing or shortness of breath.  . ALPRAZolam (XANAX) 1 MG tablet Take 1 mg by mouth 3 (three) times daily.   . Cariprazine HCl (VRAYLAR) 3 MG CAPS Take 1 capsule by mouth at bedtime.  . Cetirizine HCl (ZYRTEC ALLERGY) 10 MG CAPS Take 10 mg by mouth daily.   . furosemide (LASIX) 20 MG tablet Take 1 tablet (20 mg total) by mouth daily as needed.  Marland Kitchen HYDROcodone-acetaminophen (NORCO/VICODIN) 5-325 MG tablet Take 1-2 tablets by mouth every 6 (six) hours as needed for moderate pain.  Marland Kitchen ibuprofen (ADVIL,MOTRIN) 200 MG tablet Take 800 mg by  mouth every 8 (eight) hours as needed for mild pain or moderate pain.  Marland Kitchen lisinopril (PRINIVIL,ZESTRIL) 10 MG tablet Take 10 mg by mouth 2 (two) times daily.  . Multiple Vitamins-Minerals (MULTIVITAMIN WITH MINERALS) tablet Take 1 tablet by mouth daily.  . NON FORMULARY Pt uses CPAP machine at bedtime  . OXYGEN Inhale 3 L into the lungs at bedtime.  . Potassium 99 MG TABS Take 1 tablet by mouth every other day.  . SYMBICORT 160-4.5 MCG/ACT inhaler Inhale 2 puffs into the lungs 2 (two) times daily.   No facility-administered encounter medications on file as of 01/27/2017.     Allergies as of 01/27/2017 - Review Complete 01/26/2017  Allergen Reaction Noted  . Morphine and related Itching 06/21/2013  . Advera [alitraq] Itching 06/21/2013  . Relpax [eletriptan]  08/05/2013    Past Medical History:  Diagnosis Date  . Asthma   . Back pain   . Chronic back pain   . Depression with anxiety   . GERD (gastroesophageal reflux disease)   . Headache(784.0)   . Hypercholesterolemia   . Hypertension   . Insomnia   . Morbid obesity (HCC)     Past Surgical History:  Procedure Laterality Date  . CESAREAN SECTION    . CHOLECYSTECTOMY    . FOOT SURGERY    . GALLBLADDER SURGERY    . TONSILLECTOMY      Family History  Problem Relation Age of Onset  . Congestive  Heart Failure Mother   . Hypertension Mother     Social History   Social History  . Marital status: Divorced    Spouse name: N/A  . Number of children: 5  . Years of education: 9th    Occupational History  .  Other    n/a   Social History Main Topics  . Smoking status: Current Every Day Smoker    Packs/day: 0.25    Years: 35.00    Types: Cigarettes  . Smokeless tobacco: Never Used     Comment: 3 Cigarettes daily-01/27/17  . Alcohol use No  . Drug use: No  . Sexual activity: Yes   Other Topics Concern  . Not on file   Social History Narrative   Patient lives at home with family.   Caffeine Use: 1 cup daily     Review of systems: Review of Systems  Constitutional: Negative for fever and chills.  HENT: Negative.   Eyes: Negative for blurred vision.  Respiratory: as per HPI  Cardiovascular: Negative for chest pain and palpitations.  Gastrointestinal: Negative for vomiting, diarrhea, blood per rectum. Genitourinary: Negative for dysuria, urgency, frequency and hematuria.  Musculoskeletal: Negative for myalgias, back pain and joint pain.  Skin: Negative for itching and rash.  Neurological: Negative for dizziness, tremors, focal weakness, seizures and loss of consciousness.  Endo/Heme/Allergies: Negative for environmental allergies.  Psychiatric/Behavioral: Negative for depression, suicidal ideas and hallucinations.  All other systems reviewed and are negative.  Physical Exam: Blood pressure 138/72, pulse 90, height 5\' 6"  (1.676 m), weight (!) 342 lb 3.2 oz (155.2 kg), SpO2 96 %. Gen:      No acute distress HEENT:  EOMI, sclera anicteric Neck:     No masses; no thyromegaly Lungs:    Clear to auscultation bilaterally; normal respiratory effort CV:         Regular rate and rhythm; no murmurs Abd:      + bowel sounds; soft, non-tender; no palpable masses, no distension Ext:    No edema; adequate peripheral perfusion Skin:      Warm and dry; no rash Neuro: alert and oriented x 3 Psych: normal mood and affect  Data Reviewed: CTA 11/11/16-no pulmonary embolus, mild bibasal atelectasis. No interstitial lung disease or fibrosis.  Chest x-ray 4/26/1-hyperinflation. No acute disease I have reviewed all images personally  Echo 10/2016 echo LVEF 60-65%, normal diastolic function, normal RV Assessment:  COPD She appears stable on Symbicort and albuterol with CAT score of 12. We will continue the same regimen and assess with pulmonary function tests, alpha-1 antitrypsin levels.  Asthma  She has a history of asthma but her symptoms are not very typical and has low FENO in office today. Check CBC  with diff to eval for eosinophilia and blood allergy profile  Suspected OSA  She is on Bipap and O2 at night. She will need a split night sleep study ordered  Abnormal CT scan Reassured the patient and her daughter that there is atelectasis and no evidence of malignancy. We will continue to monitor with chest x-rays.  Active smoker Strongly encouraged to quit smoking altogether. She tried nicotine patches. Time spent counseling-5 minutes  Plan/Recommendations: - Continue Symbicort, albuterol - Check A1AT levels, CBC with diff, blood allergy profile - Split night sleep study - PFTs - Smoking cessation  Chilton GreathousePraveen Aluel Schwarz MD [facility] Pager [phone] 01/27/2017, 11:13 AM  CC: Ignatius SpeckingVyas, Dhruv B, MD

## 2017-01-28 ENCOUNTER — Other Ambulatory Visit: Payer: Self-pay | Admitting: Cardiology

## 2017-01-28 ENCOUNTER — Encounter (HOSPITAL_COMMUNITY)
Admission: RE | Admit: 2017-01-28 | Discharge: 2017-01-28 | Disposition: A | Discharge status: Home / Self Care | Payer: Medicaid Other | Source: Ambulatory Visit | Attending: Oral Surgery | Admitting: Oral Surgery

## 2017-01-28 ENCOUNTER — Encounter (HOSPITAL_COMMUNITY): Payer: Self-pay

## 2017-01-28 DIAGNOSIS — K029 Dental caries, unspecified: Secondary | ICD-10-CM | POA: Insufficient documentation

## 2017-01-28 HISTORY — DX: Heart failure, unspecified: I50.9

## 2017-01-28 HISTORY — DX: Unspecified osteoarthritis, unspecified site: M19.90

## 2017-01-28 HISTORY — DX: Pneumonia, unspecified organism: J18.9

## 2017-01-28 HISTORY — DX: Depression, unspecified: F32.A

## 2017-01-28 HISTORY — DX: Dyspnea, unspecified: R06.00

## 2017-01-28 HISTORY — DX: Fibromyalgia: M79.7

## 2017-01-28 HISTORY — DX: Sleep apnea, unspecified: G47.30

## 2017-01-28 HISTORY — DX: Major depressive disorder, single episode, unspecified: F32.9

## 2017-01-28 HISTORY — DX: Chronic obstructive pulmonary disease, unspecified: J44.9

## 2017-01-28 HISTORY — DX: Dependence on supplemental oxygen: Z99.81

## 2017-01-28 HISTORY — DX: Atelectasis: J98.11

## 2017-01-28 HISTORY — DX: Emphysema, unspecified: J43.9

## 2017-01-28 LAB — RESPIRATORY ALLERGY PROFILE REGION II ~~LOC~~
ALLERGEN, COTTONWOOD, T14: 1.15 kU/L — AB
ALLERGEN, D PTERNOYSSINUS, D1: 1.14 kU/L — AB
ALLERGEN, MULBERRY, T70: 1.25 kU/L — AB
Allergen, A. alternata, m6: <0.1 kU/L
Allergen, Cedar tree, t12: 1.19 kU/L — ABNORMAL HIGH
Allergen, Comm Silver Birch, t9: 1.16 kU/L — ABNORMAL HIGH
Allergen, Mouse Urine Protein, e78: <0.1 kU/L
Allergen, Oak,t7: 1.37 kU/L — ABNORMAL HIGH
BERMUDA GRASS: 1.42 kU/L — AB
Box Elder IgE: 1.38 kU/L — ABNORMAL HIGH
COCKROACH: 0.99 kU/L — AB
COMMON RAGWEED: 1.41 kU/L — AB
Cat Dander: <0.1 kU/L
D. farinae: 0.74 kU/L — ABNORMAL HIGH
DOG DANDER: 0.21 kU/L — AB
Elm IgE: 1.48 kU/L — ABNORMAL HIGH
IgE (Immunoglobulin E), Serum: 520 kU/L — ABNORMAL HIGH (ref <115)
Johnson Grass: 1.35 kU/L — ABNORMAL HIGH
PECAN/HICKORY TREE IGE: 1.37 kU/L — AB
Rough Pigweed  IgE: 1.18 kU/L — ABNORMAL HIGH
Sheep Sorrel IgE: 1.37 kU/L — ABNORMAL HIGH
TIMOTHY GRASS: 1.44 kU/L — AB

## 2017-01-28 LAB — CBC
HEMATOCRIT: 43.1 % (ref 36.0–46.0)
Hemoglobin: 13.8 g/dL (ref 12.0–15.0)
MCH: 32 pg (ref 26.0–34.0)
MCHC: 32 g/dL (ref 30.0–36.0)
MCV: 100 fL (ref 78.0–100.0)
Platelets: 260 10*3/uL (ref 150–400)
RBC: 4.31 MIL/uL (ref 3.87–5.11)
RDW: 13 % (ref 11.5–15.5)
WBC: 10.5 10*3/uL (ref 4.0–10.5)

## 2017-01-28 LAB — BASIC METABOLIC PANEL
Anion gap: 7 (ref 5–15)
BUN: 14 mg/dL (ref 6–20)
CALCIUM: 9.1 mg/dL (ref 8.9–10.3)
CO2: 27 mmol/L (ref 22–32)
CREATININE: 0.66 mg/dL (ref 0.44–1.00)
Chloride: 103 mmol/L (ref 101–111)
GFR calc non Af Amer: >60 mL/min (ref >60)
Glucose, Bld: 107 mg/dL — ABNORMAL HIGH (ref 65–99)
Potassium: 4.2 mmol/L (ref 3.5–5.1)
SODIUM: 137 mmol/L (ref 135–145)

## 2017-01-28 LAB — HCG, SERUM, QUALITATIVE: Preg, Serum: NEGATIVE

## 2017-01-28 NOTE — Progress Notes (Signed)
NOTIFIED ANGELA KABBE OF PATIENT BEING ADMITTED MARCH AND April 2018. PATIENT STATED SHE HAD PNEUMONIA. PATIENT STATES TODAY SHE DOES NOT FEEL BAD OR ANY WORSE THAN HER BASELINE.  ANGELA STATED WE DID NOT NEED TO REPEAT CXR.  WILL LEAVE CHART FOR ANESTHESIA REVIEW.

## 2017-01-28 NOTE — Pre-Procedure Instructions (Signed)
Marissa BoutonRegina [name]  01/28/2017      [name] [name] - [name] [name] [id number] Phone: [phone] Fax: [phone]    Your procedure is scheduled on Friday, January 30, 2017.  [facility] 07[facility]00 [facility].M.  Call this number if you have problems the morning of surgery:  [phone]   Remember:  Do not eat food or drink liquids after midnight.  Take these medicines the morning of surgery with A SIP OF WATER:  Albuterol inhaler (if needed)  Alprazolam (xanax)  Cetirizine (zyrtec)  Hydrocodone-acetaminophen (Norco/vicodin)  symbicort inhaler   Today STOP taking any Aspirin, Aleve, Naproxen, Ibuprofen, Motrin, Advil, Goody's, BC's, all herbal medications, fish oil, and all vitamins - continue Potassium    Do not wear jewelry, make-up or nail polish.  Do not wear lotions, powders, or perfumes, or deodorant.  Do not shave 48 hours prior to surgery.  Men may shave face and neck.  Do not bring valuables to the hospital.  [facility] is not responsible for any belongings or valuables.  Contacts, dentures or bridgework may not be worn into surgery.  Leave your suitcase in the car.  After surgery it may be brought to your room.  For patients admitted to the hospital, discharge time will be determined by your treatment team.  Patients discharged the day of surgery will not be allowed to drive home.   Name and phone number of your driver:    Special instructions:    [facility]- Preparing For Surgery  Before surgery, you can play an important role. Because skin is not sterile, your skin needs to be as free of germs as possible. You can reduce the number of germs on your skin by washing with CHG (chlorahexidine gluconate) Soap before surgery.  CHG is an antiseptic cleaner which kills germs and bonds with the skin to continue killing germs even after washing.  Please do not use if you have an allergy to CHG or  antibacterial soaps. If your skin becomes reddened/irritated stop using the CHG.  Do not shave (including legs and underarms) for at least 48 hours prior to first CHG shower. It is OK to shave your face.  Please follow these instructions carefully.   1. Shower the NIGHT BEFORE SURGERY and the MORNING OF SURGERY with CHG.   2. If you chose to wash your hair, wash your hair first as usual with your normal shampoo.  3. After you shampoo, rinse your hair and body thoroughly to remove the shampoo.  4. Use CHG as you would any other liquid soap. You can apply CHG directly to the skin and wash gently with a scrungie or a clean washcloth.   5. Apply the CHG Soap to your body ONLY FROM THE NECK DOWN.  Do not use on open wounds or open sores. Avoid contact with your eyes, ears, mouth and genitals (private parts). Wash genitals (private parts) with your normal soap.  6. Wash thoroughly, paying special attention to the area where your surgery will be performed.  7. Thoroughly rinse your body with warm water from the neck down.  8. DO NOT shower/wash with your normal soap after using and rinsing off the CHG Soap.  9. Pat yourself dry with a CLEAN TOWEL.   10. Wear CLEAN PAJAMAS   11. Place CLEAN SHEETS on your bed the night of your first shower and DO NOT SLEEP  WITH PETS.    Day of Surgery: Do not apply any deodorants/lotions. Please wear clean clothes to the hospital/surgery center.      Please read over the following fact sheets that you were given. Pain Booklet and Surgical Site Infection Prevention

## 2017-01-28 NOTE — H&P (Signed)
HISTORY AND PHYSICAL  Marissa [name] is a 51 y.o. female patient with CC: painful teeth  No diagnosis found.  Past Medical History:  Diagnosis Date  . Arthritis   . Asthma   . Atelectasis pulmonary   . Back pain   . CHF (congestive heart failure) (HCC)    11/2016  . Chronic back pain   . COPD (chronic obstructive pulmonary disease) (HCC)   . Depression   . Depression with anxiety   . Dyspnea   . Emphysema lung (HCC)   . Emphysema lung (HCC)   . Fibromyalgia   . GERD (gastroesophageal reflux disease)   . Headache(784.0)    HX MIGRAINES  . Hypercholesterolemia   . Hypertension   . Insomnia   . Morbid obesity (HCC)   . Pneumonia    MARCH   2018  . Requires supplemental oxygen    3 LITERS AT HS  . Sleep apnea    BIPAP +  O2    No current facility-administered medications for this encounter.    Current Outpatient Prescriptions  Medication Sig Dispense Refill  . acetaminophen (TYLENOL) 500 MG tablet Take 1,500 mg by mouth every 8 (eight) hours as needed for mild pain or moderate pain.    Marland Kitchen albuterol (PROVENTIL HFA;VENTOLIN HFA) 108 (90 BASE) MCG/ACT inhaler Inhale 2 puffs into the lungs every 4 (four) hours as needed for wheezing or shortness of breath. 1 Inhaler 2  . albuterol (PROVENTIL) (2.5 MG/3ML) 0.083% nebulizer solution Take 2.5 mg by nebulization every 6 (six) hours as needed for wheezing or shortness of breath.    . ALPRAZolam (XANAX) 1 MG tablet Take 1 mg by mouth 3 (three) times daily.     . Cariprazine HCl (VRAYLAR) 3 MG CAPS Take 1 capsule by mouth at bedtime.    . Cetirizine HCl (ZYRTEC ALLERGY) 10 MG CAPS Take 10 mg by mouth daily.     . furosemide (LASIX) 20 MG tablet Take 1 tablet (20 mg total) by mouth daily as needed. 90 tablet 1  . HYDROcodone-acetaminophen (NORCO/VICODIN) 5-325 MG tablet Take 1-2 tablets by mouth every 6 (six) hours as needed for moderate pain.    Marland Kitchen ibuprofen (ADVIL,MOTRIN) 200 MG tablet Take 800 mg by mouth every 8 (eight) hours as needed  for mild pain or moderate pain.    Marland Kitchen lisinopril (PRINIVIL,ZESTRIL) 10 MG tablet Take 10 mg by mouth 2 (two) times daily.    . Multiple Vitamins-Minerals (MULTIVITAMIN WITH MINERALS) tablet Take 1 tablet by mouth daily.    . NON FORMULARY Pt uses CPAP machine at bedtime    . OXYGEN Inhale 3 L into the lungs at bedtime.    . Potassium 99 MG TABS Take 1 tablet by mouth every other day.    . SYMBICORT 160-4.5 MCG/ACT inhaler Inhale 2 puffs into the lungs 2 (two) times daily.  4   Allergies  Allergen Reactions  . Morphine And Related Itching  . Advera [Alitraq] Itching    Throat Itching  . Relpax [Eletriptan]     Weakness, couldn't move   Active Problems:   * No active hospital problems. *  Vitals: Last menstrual period 01/25/2017. Lab results: Results for orders placed or performed during the hospital encounter of 01/28/17 (from the past 24 hour(s))  CBC     Status: None   Collection Time: 01/28/17 10:14 AM  Result Value Ref Range   WBC 10.5 4.0 - 10.5 K/uL   RBC 4.31 3.87 - 5.11 MIL/uL  Hemoglobin 13.8 12.0 - 15.0 g/dL   HCT 29.543.1 62.136.0 - 30.846.0 %   MCV 100.0 78.0 - 100.0 fL   MCH 32.0 26.0 - 34.0 pg   MCHC 32.0 30.0 - 36.0 g/dL   RDW 65.713.0 84.611.5 - 96.215.5 %   Platelets 260 150 - 400 K/uL  Basic metabolic panel     Status: Abnormal   Collection Time: 01/28/17 10:14 AM  Result Value Ref Range   Sodium 137 135 - 145 mmol/L   Potassium 4.2 3.5 - 5.1 mmol/L   Chloride 103 101 - 111 mmol/L   CO2 27 22 - 32 mmol/L   Glucose, Bld 107 (H) 65 - 99 mg/dL   BUN 14 6 - 20 mg/dL   Creatinine, Ser 9.520.66 0.44 - 1.00 mg/dL   Calcium 9.1 8.9 - 84.110.3 mg/dL   GFR calc non Af Amer >60 >60 mL/min   GFR calc Af Amer >60 >60 mL/min   Anion gap 7 5 - 15  hCG, serum, qualitative     Status: None   Collection Time: 01/28/17 10:14 AM  Result Value Ref Range   Preg, Serum NEGATIVE NEGATIVE   Radiology Results: No results found. General appearance: alert, cooperative, no distress and morbidly  obese Head: Normocephalic, without obvious abnormality, atraumatic Eyes: negative Nose: Nares normal. Septum midline. Mucosa normal. No drainage or sinus tenderness. Throat: lips, mucosa, and tongue normal; teeth and gums normal and deep cervical decay teeth # 15, 17, 31. Pharynx clear. No edema, purulence, fluctuance. Neck: no adenopathy, supple, symmetrical, trachea midline and thyroid not enlarged, symmetric, no tenderness/mass/nodules Resp: clear to auscultation bilaterally Cardio: regular rate and rhythm, S1, S2 normal, no murmur, click, rub or gallop  Assessment: Nonrestorable teeth 15, 17, 31  Plan: Dental extractions 15, 17, 31. GA. Day surgery.   Marissa [name] M 01/28/2017

## 2017-01-29 NOTE — Anesthesia Preprocedure Evaluation (Addendum)
Anesthesia Evaluation  Patient identified by MRN, date of birth, ID band Patient awake    Reviewed: Allergy & Precautions, H&P , NPO status , Patient's Chart, lab work & pertinent test results  Airway Mallampati: II  TM Distance: >3 FB Neck ROM: Full    Dental no notable dental hx. (+) Teeth Intact, Dental Advisory Given   Pulmonary asthma , sleep apnea, Continuous Positive Airway Pressure Ventilation and Oxygen sleep apnea , COPD,  COPD inhaler and oxygen dependent, Current Smoker,    Pulmonary exam normal breath sounds clear to auscultation       Cardiovascular Exercise Tolerance: Good hypertension, Pt. on medications  Rhythm:Regular Rate:Normal     Neuro/Psych  Headaches, Depression    GI/Hepatic Neg liver ROS, GERD  Controlled,  Endo/Other  Morbid obesity  Renal/GU negative Renal ROS  negative genitourinary   Musculoskeletal  (+) Arthritis , Osteoarthritis,  Fibromyalgia -  Abdominal   Peds  Hematology negative hematology ROS (+)   Anesthesia Other Findings   Reproductive/Obstetrics negative OB ROS                           Anesthesia Physical Anesthesia Plan  ASA: III  Anesthesia Plan: General   Post-op Pain Management:    Induction: Intravenous  PONV Risk Score and Plan: 3 and Ondansetron, Dexamethasone, Propofol and Midazolam  Airway Management Planned: Nasal ETT and Video Laryngoscope Planned  Additional Equipment:   Intra-op Plan:   Post-operative Plan: Extubation in OR  Informed Consent: I have reviewed the patients History and Physical, chart, labs and discussed the procedure including the risks, benefits and alternatives for the proposed anesthesia with the patient or authorized representative who has indicated his/her understanding and acceptance.   Dental advisory given  Plan Discussed with: CRNA, Anesthesiologist and Surgeon  Anesthesia Plan Comments:        Anesthesia Quick Evaluation

## 2017-01-29 NOTE — Progress Notes (Signed)
Anesthesia chart review: Patient is a 51 year old female scheduled for extraction teeth 15, 17, 31 on 01/30/2017 by Dr. Barbette MerinoJensen.  History includes smoking, asthma, hypertension, hypercholesterolemia, anxiety, depression, GERD, CHF, COPD, OSA (CPAP, nocturnal 02), fibromyalgia, migraines, cholecystectomy, tonsillectomy. BMI is consistent with super morbid obesity. Patient reported admissions for "pneumonia" in 10/2016 and 11/3016, but by pulmonology notes, patient was hospitalized [facility] 10/2016 for chest pain (saw cardiology; see below) and 11/2016 for CHF and COPD exacerbation (saw pulmonology; see below). 10/2016 echo showed normal LV systolic and diastolic function, normal EF. 1/47/824/26/18 CXR showed no PNA.   - PCP is Dr. Doreen Beamhruv Vyas. - Cardiologist is Dr. Dina RichJonathan Branch, last visit 11/25/16 for evaluation of atypical chest pain. Echo showed normal LVEF and systolic function. No further work-up recommended. She also reported occasional palpitations with stress. Unless progressive, he did not recommend a home monitor at that time. - Pulmonologist is Dr. Chilton GreathousePraveen Mannam, last visit 01/27/17. COPD was felt stable. He is planning for PFTs, A1AT levels, blood allergy profile (done 01/27/17, but A1AT still in process). Split night sleep study also ordered. He continued Symbicort and albuterol. Smoking cessation recommended.   EKG 12/14/16 [facility]): SR, LAA, consider biatrial abnormalities.   Echo 11/11/16 [facility]): Conclusions: 1. Normal left ventricular chamber size and wall thickness. LVEF 60-65%. 2. Normal diastolic function. 3. Normal biatrial size.  4. All valves without significant stenosis or insufficiency. 5. Grossly normal aorta.  Chest x-ray 12/18/16 [facility] Findings: The lungs are hyperinflated likely secondary to COPD. There is bilateral interstitial thickening likely chronic. There is no focal parenchymal [place]. There is no pleural effusion or pneumothorax. The heart  and mediastinal contours are unremarkable. The osseous structures are unremarkable. No active cardiopulmonary disease. hyperinflation. No acute disease  According to 01/27/17 pulmonary note, "CTA 11/11/16-no pulmonary embolus, mild bibasal atelectasis. No interstitial lung disease or fibrosis."  Preoperative labs noted.  If no acute cardiopulmonary issues/symptoms then I would anticipate that she can proceed with dental extractions.   Velna Ochsllison Nitisha Civello, PA-C [facility]iology Phone [phone] 01/29/2017 1:52 PM

## 2017-01-30 ENCOUNTER — Ambulatory Visit (HOSPITAL_COMMUNITY)
Admission: RE | Admit: 2017-01-30 | Discharge: 2017-01-30 | Disposition: A | Discharge status: Home / Self Care | Payer: Medicaid Other | Source: Ambulatory Visit | Attending: Oral Surgery | Admitting: Oral Surgery

## 2017-01-30 ENCOUNTER — Ambulatory Visit (HOSPITAL_COMMUNITY): Payer: Medicaid Other | Admitting: Emergency Medicine

## 2017-01-30 ENCOUNTER — Ambulatory Visit (HOSPITAL_COMMUNITY): Payer: Medicaid Other | Admitting: Anesthesiology

## 2017-01-30 ENCOUNTER — Encounter (HOSPITAL_COMMUNITY)
Admission: RE | Disposition: A | Discharge status: Home / Self Care | Payer: Self-pay | Source: Ambulatory Visit | Attending: Oral Surgery

## 2017-01-30 ENCOUNTER — Encounter (HOSPITAL_COMMUNITY): Payer: Self-pay | Admitting: *Deleted

## 2017-01-30 DIAGNOSIS — G4700 Insomnia, unspecified: Secondary | ICD-10-CM | POA: Insufficient documentation

## 2017-01-30 DIAGNOSIS — M1990 Unspecified osteoarthritis, unspecified site: Secondary | ICD-10-CM | POA: Diagnosis not present

## 2017-01-30 DIAGNOSIS — E7800 Pure hypercholesterolemia, unspecified: Secondary | ICD-10-CM | POA: Diagnosis not present

## 2017-01-30 DIAGNOSIS — Z9981 Dependence on supplemental oxygen: Secondary | ICD-10-CM | POA: Diagnosis not present

## 2017-01-30 DIAGNOSIS — Z9989 Dependence on other enabling machines and devices: Secondary | ICD-10-CM | POA: Insufficient documentation

## 2017-01-30 DIAGNOSIS — K0889 Other specified disorders of teeth and supporting structures: Secondary | ICD-10-CM | POA: Insufficient documentation

## 2017-01-30 DIAGNOSIS — Z885 Allergy status to narcotic agent status: Secondary | ICD-10-CM | POA: Insufficient documentation

## 2017-01-30 DIAGNOSIS — K219 Gastro-esophageal reflux disease without esophagitis: Secondary | ICD-10-CM | POA: Diagnosis not present

## 2017-01-30 DIAGNOSIS — K029 Dental caries, unspecified: Secondary | ICD-10-CM | POA: Diagnosis not present

## 2017-01-30 DIAGNOSIS — F418 Other specified anxiety disorders: Secondary | ICD-10-CM | POA: Insufficient documentation

## 2017-01-30 DIAGNOSIS — Z6841 Body Mass Index (BMI) 40.0 and over, adult: Secondary | ICD-10-CM | POA: Diagnosis not present

## 2017-01-30 DIAGNOSIS — Z888 Allergy status to other drugs, medicaments and biological substances status: Secondary | ICD-10-CM | POA: Diagnosis not present

## 2017-01-30 DIAGNOSIS — G4730 Sleep apnea, unspecified: Secondary | ICD-10-CM | POA: Insufficient documentation

## 2017-01-30 DIAGNOSIS — M797 Fibromyalgia: Secondary | ICD-10-CM | POA: Insufficient documentation

## 2017-01-30 DIAGNOSIS — Z79899 Other long term (current) drug therapy: Secondary | ICD-10-CM | POA: Insufficient documentation

## 2017-01-30 DIAGNOSIS — I509 Heart failure, unspecified: Secondary | ICD-10-CM | POA: Insufficient documentation

## 2017-01-30 DIAGNOSIS — J439 Emphysema, unspecified: Secondary | ICD-10-CM | POA: Insufficient documentation

## 2017-01-30 DIAGNOSIS — I110 Hypertensive heart disease with heart failure: Secondary | ICD-10-CM | POA: Diagnosis not present

## 2017-01-30 HISTORY — PX: TOOTH EXTRACTION: SHX859

## 2017-01-30 SURGERY — EXTRACTION, TOOTH, MOLAR
Anesthesia: General | Site: Mouth

## 2017-01-30 MED ORDER — MIDAZOLAM HCL 5 MG/5ML IJ SOLN
INTRAMUSCULAR | Status: DC | PRN
  Administered 2017-01-30: 2 mg via INTRAVENOUS

## 2017-01-30 MED ORDER — OXYCODONE-ACETAMINOPHEN 7.5-325 MG PO TABS: 1.0000 | ORAL_TABLET | ORAL | 0 refills | Status: DC | PRN

## 2017-01-30 MED ORDER — HYDROMORPHONE HCL 1 MG/ML IJ SOLN: 0.2500 mg | INTRAMUSCULAR | Status: DC | PRN

## 2017-01-30 MED ORDER — SUCCINYLCHOLINE CHLORIDE 200 MG/10ML IV SOSY
PREFILLED_SYRINGE | INTRAVENOUS | Status: DC | PRN
  Administered 2017-01-30: 120 mg via INTRAVENOUS

## 2017-01-30 MED ORDER — FENTANYL CITRATE (PF) 100 MCG/2ML IJ SOLN
INTRAMUSCULAR | Status: DC | PRN
  Administered 2017-01-30: 100 ug via INTRAVENOUS

## 2017-01-30 MED ORDER — OXYCODONE-ACETAMINOPHEN 5-325 MG PO TABS
ORAL_TABLET | ORAL | Status: AC
  Filled 2017-01-30: qty 1

## 2017-01-30 MED ORDER — ROCURONIUM BROMIDE 10 MG/ML (PF) SYRINGE
PREFILLED_SYRINGE | INTRAVENOUS | Status: AC
  Filled 2017-01-30: qty 5

## 2017-01-30 MED ORDER — FENTANYL CITRATE (PF) 250 MCG/5ML IJ SOLN
INTRAMUSCULAR | Status: AC
  Filled 2017-01-30: qty 5

## 2017-01-30 MED ORDER — 0.9 % SODIUM CHLORIDE (POUR BTL) OPTIME
TOPICAL | Status: DC | PRN
  Administered 2017-01-30: 1000 mL

## 2017-01-30 MED ORDER — HYDROCODONE-ACETAMINOPHEN 7.5-325 MG PO TABS
ORAL_TABLET | ORAL | Status: AC
  Filled 2017-01-30: qty 1

## 2017-01-30 MED ORDER — SODIUM CHLORIDE 0.9 % IV SOLN
INTRAVENOUS | Status: DC
  Administered 2017-01-30: 08:00:00 via INTRAVENOUS

## 2017-01-30 MED ORDER — OXYMETAZOLINE HCL 0.05 % NA SOLN
NASAL | Status: AC
  Filled 2017-01-30: qty 15

## 2017-01-30 MED ORDER — OXYCODONE-ACETAMINOPHEN 5-325 MG PO TABS
1.0000 | ORAL_TABLET | Freq: Once | ORAL | Status: AC
  Administered 2017-01-30: 1 via ORAL

## 2017-01-30 MED ORDER — SUCCINYLCHOLINE CHLORIDE 200 MG/10ML IV SOSY
PREFILLED_SYRINGE | INTRAVENOUS | Status: AC
  Filled 2017-01-30: qty 10

## 2017-01-30 MED ORDER — LIDOCAINE 2% (20 MG/ML) 5 ML SYRINGE
INTRAMUSCULAR | Status: AC
  Filled 2017-01-30: qty 5

## 2017-01-30 MED ORDER — PROPOFOL 10 MG/ML IV BOLUS
INTRAVENOUS | Status: DC | PRN
Start: 2017-01-30 — End: 2017-01-30
  Administered 2017-01-30: 150 mg via INTRAVENOUS

## 2017-01-30 MED ORDER — CEFAZOLIN SODIUM 1 G IJ SOLR
INTRAMUSCULAR | Status: DC | PRN
  Administered 2017-01-30: 3 g via INTRAMUSCULAR

## 2017-01-30 MED ORDER — OXYCODONE HCL 5 MG PO TABS
ORAL_TABLET | ORAL | Status: AC
  Filled 2017-01-30: qty 1

## 2017-01-30 MED ORDER — OXYCODONE HCL 5 MG PO TABS
2.5000 mg | ORAL_TABLET | Freq: Once | ORAL | Status: AC
  Administered 2017-01-30: 2.5 mg via ORAL

## 2017-01-30 MED ORDER — LIDOCAINE-EPINEPHRINE 2 %-1:100000 IJ SOLN
INTRAMUSCULAR | Status: DC | PRN
  Administered 2017-01-30: 10 mL

## 2017-01-30 MED ORDER — LACTATED RINGERS IV SOLN
INTRAVENOUS | Status: DC | PRN
  Administered 2017-01-30: 09:00:00 via INTRAVENOUS

## 2017-01-30 MED ORDER — PROPOFOL 10 MG/ML IV BOLUS
INTRAVENOUS | Status: AC
  Filled 2017-01-30: qty 20

## 2017-01-30 MED ORDER — SODIUM CHLORIDE 0.9 % IR SOLN
Status: DC | PRN
  Administered 2017-01-30: 1000 mL

## 2017-01-30 MED ORDER — ONDANSETRON HCL 4 MG/2ML IJ SOLN
INTRAMUSCULAR | Status: DC | PRN
  Administered 2017-01-30: 4 mg via INTRAVENOUS

## 2017-01-30 MED ORDER — LIDOCAINE-EPINEPHRINE 2 %-1:100000 IJ SOLN
INTRAMUSCULAR | Status: AC
  Filled 2017-01-30: qty 1

## 2017-01-30 MED ORDER — ONDANSETRON HCL 4 MG/2ML IJ SOLN
INTRAMUSCULAR | Status: AC
  Filled 2017-01-30: qty 2

## 2017-01-30 MED ORDER — MIDAZOLAM HCL 2 MG/2ML IJ SOLN
INTRAMUSCULAR | Status: AC
  Filled 2017-01-30: qty 2

## 2017-01-30 MED ORDER — LIDOCAINE HCL (CARDIAC) 20 MG/ML IV SOLN
INTRAVENOUS | Status: DC | PRN
  Administered 2017-01-30: 60 mg via INTRAVENOUS

## 2017-01-30 SURGICAL SUPPLY — 29 items
BLADE SURG 15 STRL LF DISP TIS (BLADE) ×1 IMPLANT
BLADE SURG 15 STRL SS (BLADE) ×3
BUR CROSS CUT FISSURE 1.6 (BURR) ×2 IMPLANT
BUR CROSS CUT FISSURE 1.6MM (BURR) ×1
CANISTER SUCT 3000ML PPV (MISCELLANEOUS) ×3 IMPLANT
COVER SURGICAL LIGHT HANDLE (MISCELLANEOUS) ×3 IMPLANT
DECANTER SPIKE VIAL GLASS SM (MISCELLANEOUS) ×2 IMPLANT
GAUZE PACKING FOLDED 2  STR (GAUZE/BANDAGES/DRESSINGS) ×2
GAUZE PACKING FOLDED 2 STR (GAUZE/BANDAGES/DRESSINGS) ×1 IMPLANT
GAUZE SPONGE 4X4 16PLY XRAY LF (GAUZE/BANDAGES/DRESSINGS) ×2 IMPLANT
GLOVE BIO SURGEON STRL SZ 6.5 (GLOVE) ×2 IMPLANT
GLOVE BIO SURGEON STRL SZ7.5 (GLOVE) ×3 IMPLANT
GLOVE BIO SURGEONS STRL SZ 6.5 (GLOVE) ×1
GLOVE BIOGEL PI IND STRL 7.0 (GLOVE) ×1 IMPLANT
GLOVE BIOGEL PI INDICATOR 7.0 (GLOVE) ×2
GOWN STRL REUS W/ TWL LRG LVL3 (GOWN DISPOSABLE) ×1 IMPLANT
GOWN STRL REUS W/ TWL XL LVL3 (GOWN DISPOSABLE) ×1 IMPLANT
GOWN STRL REUS W/TWL LRG LVL3 (GOWN DISPOSABLE) ×3
GOWN STRL REUS W/TWL XL LVL3 (GOWN DISPOSABLE) ×3
KIT BASIN OR (CUSTOM PROCEDURE TRAY) ×3 IMPLANT
KIT ROOM TURNOVER OR (KITS) ×3 IMPLANT
NEEDLE 22X1 1/2 (OR ONLY) (NEEDLE) ×5 IMPLANT
NS IRRIG 1000ML POUR BTL (IV SOLUTION) ×3 IMPLANT
PAD ARMBOARD 7.5X6 YLW CONV (MISCELLANEOUS) ×6 IMPLANT
SUT CHROMIC 3 0 PS 2 (SUTURE) ×4 IMPLANT
SYR CONTROL 10ML LL (SYRINGE) ×3 IMPLANT
TOWEL OR 17X26 10 PK STRL BLUE (TOWEL DISPOSABLE) ×3 IMPLANT
TRAY ENT MC OR (CUSTOM PROCEDURE TRAY) ×3 IMPLANT
YANKAUER SUCT BULB TIP NO VENT (SUCTIONS) ×3 IMPLANT

## 2017-01-30 NOTE — Anesthesia Procedure Notes (Signed)
Procedure Name: Intubation Date/Time: 01/30/2017 9:10 AM Performed by: Rogelia BogaMUELLER, Olivea Sonnen P Pre-anesthesia Checklist: Patient identified, Emergency Drugs available, Suction available, Patient being monitored and Timeout performed Patient Re-evaluated:Patient Re-evaluated prior to inductionOxygen Delivery Method: Circle system utilized Preoxygenation: Pre-oxygenation with 100% oxygen Intubation Type: IV induction Ventilation: Mask ventilation without difficulty and Oral airway inserted - appropriate to patient size Laryngoscope Size: Glidescope and 4 Grade View: Grade I Tube type: Oral Nasal Tubes: Right, Nasal prep performed and Nasal Rae Tube size: 7.0 mm Number of attempts: 1 Airway Equipment and Method: Video-laryngoscopy Placement Confirmation: ETT inserted through vocal cords under direct vision,  positive ETCO2 and breath sounds checked- equal and bilateral Tube secured with: Tape Dental Injury: Teeth and Oropharynx as per pre-operative assessment

## 2017-01-30 NOTE — Op Note (Signed)
01/30/2017  9:21 AM  PATIENT:  Marissa [name]  51 y.o. female  PRE-OPERATIVE DIAGNOSIS:  non restorable teeth 15, 17, 31  POST-OPERATIVE DIAGNOSIS:  SAME  PROCEDURE:  Procedure(s): EXTRACTION  Teeth 15, 17, 31  SURGEON:  Surgeon(s): Ocie DoyneJensen, Onie Kasparek, DDS  ANESTHESIA:   local and general  EBL:  minimal  DRAINS: none   SPECIMEN:  No Specimen  COUNTS:  YES  PLAN OF CARE: Discharge to home after PACU  PATIENT DISPOSITION:  PACU - hemodynamically stable.   PROCEDURE DETAILS: Dictation # [id number]  Georgia LopesScott M. Franciszek Platten, DMD 01/30/2017 9:21 AM

## 2017-01-30 NOTE — Addendum Note (Signed)
Addendum  created 01/30/17 1557 by Adair LaundryPaxton, Siniyah Evangelist A, CRNA   Anesthesia Intra Meds edited

## 2017-01-30 NOTE — Anesthesia Postprocedure Evaluation (Signed)
Anesthesia Post Note  Patient: Marissa BoutonRegina [name]  Procedure(s) Performed: Procedure(s) (LRB): EXTRACTION TEETH 15, 17, 31 (N/A)     Patient location during evaluation: PACU Anesthesia Type: General Level of consciousness: awake and alert Pain management: pain level controlled Vital Signs Assessment: post-procedure vital signs reviewed and stable Respiratory status: spontaneous breathing, nonlabored ventilation and respiratory function stable Cardiovascular status: blood pressure returned to baseline and stable Postop Assessment: no signs of nausea or vomiting Anesthetic complications: no    Last Vitals:  Vitals:   01/30/17 1010 01/30/17 1013  BP:  122/63  Pulse: 72 73  Resp: 12   Temp: 36.1 C     Last Pain:  Vitals:   01/30/17 1005  TempSrc:   PainSc: 6                  Rosio Weiss,W. EDMOND

## 2017-01-30 NOTE — H&P (Signed)
H&P documentation  -History and Physical Reviewed  -Patient has been re-examined  -No change in the plan of care  [name]  

## 2017-01-30 NOTE — Op Note (Signed)
NAME:  Strnad, Chelsy                      ACCOUNT NO.:  MEDICAL RECORD NO.:  [id number]  LOCATION:                                 FACILITY:  PHYSICIAN:  Georgia LopesScott M. Kerry Odonohue, M.D.       DATE OF BIRTH:  DATE OF PROCEDURE:  01/30/2017 DATE OF DISCHARGE:                              OPERATIVE REPORT   PREOPERATIVE DIAGNOSIS:  Nonrestorable teeth numbers 15, 17, 31.  POSTOPERATIVE DIAGNOSIS:  Nonrestorable teeth numbers 15, 17, 31.  PROCEDURE:  Extraction of teeth numbers 15, 17, 31.  SURGEON:  Georgia LopesScott M. Shelly Shoultz, M.D.  ANESTHESIA:  Jennell CornerGeneral.  Fitzgerald, attending.  Nasal intubation.  DESCRIPTION OF PROCEDURE:  The patient was taken to the operating room and placed on the table in supine position.  General anesthesia was administered and a nasal endotracheal tube was placed and secured.  The eyes were protected.  The patient was draped for the procedure.  Time- out was performed.  The posterior pharynx was suctioned.  A throat pack was placed.  A 2% lidocaine with 1:100,000 epinephrine was infiltrated in and around tooth #15 buccally and palatally and in an inferior alveolar block technique on the right and left side.  A total of 10 mL was utilized.  A bite block was placed on the right side of the mouth and a sweetheart retractor was used to retract the tongue.  A #15 blade was used to make an incision around tooth #15 and #17 circumferentially in the gingival sulcus.  The periosteum was reflected.  The teeth were elevated with a 301 elevator and removed from the mouth with dental forceps.  The sockets were curetted and irrigated.  No suture was required.  The sweetheart and bite block were repositioned at the side of the mouth.  A similar technique was used to extract tooth #31.  No suture was required in this area either.  The oral cavity was then irrigated and suctioned.  Throat pack was removed.  The patient was awakened and left in the care of Anesthesia for transportation to  the recovery room and discharged. estimated blood loss.  ESTIMATED BLOOD LOSS:  Minimal.  COMPLICATIONS:  None.  SPECIMENS:  None.    Georgia LopesScott M. Cornesha Radziewicz, M.D.    SMJ/MEDQ  D:  01/30/2017  T:  01/30/2017  Job:  [id number]

## 2017-01-30 NOTE — Transfer of Care (Signed)
Immediate Anesthesia Transfer of Care Note  Patient: Marissa [name]  Procedure(s) Performed: Procedure(s): EXTRACTION TEETH 15, 17, 31 (N/A)  Patient Location: PACU  Anesthesia Type:General  Level of Consciousness: awake, alert , oriented and patient cooperative  Airway & Oxygen Therapy: Patient Spontanous Breathing and Patient connected to nasal cannula oxygen  Post-op Assessment: Report given to RN, Post -op Vital signs reviewed and stable and Patient moving all extremities X 4  Post vital signs: Reviewed and stable  Last Vitals:  Vitals:   01/30/17 0932 01/30/17 0934  BP:  (!) 88/58  Pulse: 75   Resp: (!) 22   Temp: (!) 36 C     Last Pain:  Vitals:   01/30/17 0700  TempSrc: Oral      Patients Stated Pain Goal: 5 (01/30/17 0730)  Complications: No apparent anesthesia complications

## 2017-01-31 ENCOUNTER — Encounter (HOSPITAL_COMMUNITY): Payer: Self-pay | Admitting: Oral Surgery

## 2017-01-31 LAB — ALPHA-1 ANTITRYPSIN PHENOTYPE: A1 ANTITRYPSIN: 158 mg/dL (ref 83–199)

## 2017-04-24 ENCOUNTER — Ambulatory Visit (INDEPENDENT_AMBULATORY_CARE_PROVIDER_SITE_OTHER): Payer: Medicaid Other | Admitting: Pulmonary Disease

## 2017-04-24 ENCOUNTER — Ambulatory Visit: Payer: Medicaid Other | Admitting: Pulmonary Disease

## 2017-04-24 ENCOUNTER — Encounter: Payer: Self-pay | Admitting: Pulmonary Disease

## 2017-04-24 VITALS — BP 136/78 | HR 85 | Ht 65.0 in | Wt 352.0 lb

## 2017-04-24 DIAGNOSIS — J439 Emphysema, unspecified: Secondary | ICD-10-CM | POA: Diagnosis not present

## 2017-04-24 DIAGNOSIS — J4530 Mild persistent asthma, uncomplicated: Secondary | ICD-10-CM

## 2017-04-24 DIAGNOSIS — R0602 Shortness of breath: Secondary | ICD-10-CM

## 2017-04-24 DIAGNOSIS — G4733 Obstructive sleep apnea (adult) (pediatric): Secondary | ICD-10-CM | POA: Diagnosis not present

## 2017-04-24 DIAGNOSIS — F17210 Nicotine dependence, cigarettes, uncomplicated: Secondary | ICD-10-CM

## 2017-04-24 LAB — PULMONARY FUNCTION TEST
DL/VA % PRED: 94 %
DL/VA: 4.66 ml/min/mmHg/L
DLCO UNC % PRED: 87 %
DLCO cor % pred: 87 %
DLCO cor: 22.46 ml/min/mmHg
DLCO unc: 22.46 ml/min/mmHg
FEF 25-75 PRE: 1.12 L/s
FEF 25-75 Post: 1.71 L/sec
FEF2575-%CHANGE-POST: 52 %
FEF2575-%Pred-Post: 60 %
FEF2575-%Pred-Pre: 39 %
FEV1-%CHANGE-POST: 14 %
FEV1-%PRED-PRE: 55 %
FEV1-%Pred-Post: 64 %
FEV1-PRE: 1.62 L
FEV1-Post: 1.86 L
FEV1FVC-%Change-Post: 1 %
FEV1FVC-%Pred-Pre: 85 %
FEV6-%Change-Post: 12 %
FEV6-%PRED-POST: 74 %
FEV6-%PRED-PRE: 66 %
FEV6-PRE: 2.37 L
FEV6-Post: 2.67 L
FEV6FVC-%Pred-Post: 102 %
FEV6FVC-%Pred-Pre: 102 %
FVC-%Change-Post: 12 %
FVC-%PRED-POST: 72 %
FVC-%Pred-Pre: 64 %
FVC-Post: 2.67 L
FVC-Pre: 2.37 L
POST FEV1/FVC RATIO: 70 %
POST FEV6/FVC RATIO: 100 %
PRE FEV6/FVC RATIO: 100 %
Pre FEV1/FVC ratio: 68 %
RV % pred: 162 %
RV: 2.98 L
TLC % pred: 107 %
TLC: 5.58 L

## 2017-04-24 NOTE — Progress Notes (Signed)
PFT done today. 

## 2017-04-24 NOTE — Patient Instructions (Signed)
Continue using the Symbicort and albuterol Please work on smoking cessation next and we will reorder the split-night sleep study to reevaluate sleep apnea  Return to clinic in 6 months.

## 2017-04-24 NOTE — Progress Notes (Addendum)
Marissa [name]    [id number]    September 29, 1965  Primary Care Physician:Vyas, Angelina Pih, MD  Referring Physician: Ignatius Specking, MD [address] [place], [place] [address]  Chief complaint:  Follow up for COPD  HPI: 51 year old with diagnosis of COPD. She was hospitalized at [facility] in March 2018 for chest pain. She had a CT at that time which was negative for PE. Atypical chest pain has since resolved. She was hospitalized again in April 2018 for CHF, COPD exacerbation. She was discharged on 3 L oxygen at night which she is using intermittently. She was diagnosed with COPD about 5 years ago and is using Symbicort and albuterol. She was also told she has asthma.  Pets:No pets, no birds Occupation: Disabled currently Exposures:Mold exposure recently (she moved from apt 3 months ago) , no exposure to asbestos, dust, silica. No jaccuzi, hot tub.  Smoking history: 30 pack year history. Active smoker.  Interim History: She states that his breathing is improved on the Symbicort. She still has chronic congestion with dyspnea, cough. She's cut down her smoking but is finding it difficult to quit altogether. Sleep study was ordered but she has been canceled 3 times due to personal reasons.  Outpatient Encounter Prescriptions as of 04/24/2017  Medication Sig  . albuterol (PROVENTIL HFA;VENTOLIN HFA) 108 (90 BASE) MCG/ACT inhaler Inhale 2 puffs into the lungs every 4 (four) hours as needed for wheezing or shortness of breath.  Marland Kitchen albuterol (PROVENTIL) (2.5 MG/3ML) 0.083% nebulizer solution Take 2.5 mg by nebulization every 6 (six) hours as needed for wheezing or shortness of breath.  . ALPRAZolam (XANAX) 1 MG tablet Take 1 mg by mouth 3 (three) times daily.   . Cariprazine HCl (VRAYLAR) 3 MG CAPS Take 1 capsule by mouth at bedtime.  . Cetirizine HCl (ZYRTEC ALLERGY) 10 MG CAPS Take 10 mg by mouth daily.   . furosemide (LASIX) 20 MG tablet TAKE (1) TABLET BY MOUTH ONCE DAILY AS NEEDED.  Marland Kitchen  ibuprofen (ADVIL,MOTRIN) 200 MG tablet Take 800 mg by mouth every 8 (eight) hours as needed for mild pain or moderate pain.  Marland Kitchen lisinopril (PRINIVIL,ZESTRIL) 10 MG tablet Take 10 mg by mouth 2 (two) times daily.  . Multiple Vitamins-Minerals (MULTIVITAMIN WITH MINERALS) tablet Take 1 tablet by mouth daily.  . NON FORMULARY Pt uses CPAP machine at bedtime  . oxyCODONE-acetaminophen (PERCOCET) 7.5-325 MG tablet Take 1 tablet by mouth every 4 (four) hours as needed for severe pain.  . OXYGEN Inhale 3 L into the lungs at bedtime.  . Potassium 99 MG TABS Take 1 tablet by mouth every other day.  . SYMBICORT 160-4.5 MCG/ACT inhaler Inhale 2 puffs into the lungs 2 (two) times daily.   No facility-administered encounter medications on file as of 04/24/2017.    Physical Exam: Blood pressure 136/78, pulse 85, height 5\' 5"  (1.651 m), weight (!) 352 lb (159.7 kg), SpO2 94 %. Gen:      No acute distress HEENT:  EOMI, sclera anicteric Neck:     No masses; no thyromegaly Lungs:    Clear to auscultation bilaterally; normal respiratory effort CV:         Regular rate and rhythm; no murmurs Abd:      + bowel sounds; soft, non-tender; no palpable masses, no distension Ext:    No edema; adequate peripheral perfusion Skin:      Warm and dry; no rash Neuro: alert and oriented x 3 Psych: normal  mood and affect  Data Reviewed: CTA 11/11/16-no pulmonary embolus, mild bibasal atelectasis. No interstitial lung disease or fibrosis.  Chest x-ray 12/18/16-hyperinflation. No acute disease I have reviewed all images personally  PFTs 04/24/17 FVC 2.67 (72%) FEV1 1.86 [64%] F/F 70 TLC 107% RV/TLC 152% DLCO 87% Moderate obstruction with bronchodilator response  Echo 10/2016 echo LVEF 60-65%, normal diastolic function, normal RV  [id number] 01/27/17- 158, PIMM CBC 6/5/1 8-WBC 10.3, basophils 1.2 percent, absolute eosinophil count 100 Blood allergic profile 6/5/1 8-sensitive to multiple allergens, IgE 520   Assessment:    COPD Stable on Symbicort and albuterol.  Asthma  PFTs reviewed which show moderate obstruction bronchodilator response. Elevated IgE noted with no eosinophils She'll continue on inhalers as above.  Suspected OSA  She is on Bipap and O2 at night. Reorder split-night sleep study as she hasn't had her testing yet  Active smoker Strongly encouraged to quit smoking altogether. She tried nicotine patches. Time spent counseling-5 minutes Cant use chantix due to issues with depression.  Plan/Recommendations: - Continue Symbicort, albuterol - Split night sleep study - Smoking cessation  Chilton GreathousePraveen Benuel Ly MD [facility] Pager [phone] 04/24/2017, 2:43 PM  CC: Ignatius SpeckingVyas, Dhruv B, MD

## 2017-06-30 ENCOUNTER — Ambulatory Visit: Payer: Self-pay | Admitting: Physician Assistant

## 2017-07-08 ENCOUNTER — Ambulatory Visit: Payer: Medicaid Other | Admitting: Physician Assistant

## 2017-07-08 ENCOUNTER — Encounter: Payer: Self-pay | Admitting: Physician Assistant

## 2017-07-08 VITALS — BP 123/92 | HR 94 | Temp 98.5°F | Ht 65.0 in | Wt 350.8 lb

## 2017-07-08 DIAGNOSIS — J439 Emphysema, unspecified: Secondary | ICD-10-CM | POA: Diagnosis not present

## 2017-07-08 DIAGNOSIS — M791 Myalgia, unspecified site: Secondary | ICD-10-CM

## 2017-07-08 DIAGNOSIS — Z716 Tobacco abuse counseling: Secondary | ICD-10-CM | POA: Insufficient documentation

## 2017-07-08 DIAGNOSIS — I10 Essential (primary) hypertension: Secondary | ICD-10-CM

## 2017-07-08 DIAGNOSIS — M25552 Pain in left hip: Secondary | ICD-10-CM

## 2017-07-08 DIAGNOSIS — I509 Heart failure, unspecified: Secondary | ICD-10-CM | POA: Diagnosis not present

## 2017-07-08 DIAGNOSIS — M2550 Pain in unspecified joint: Secondary | ICD-10-CM | POA: Insufficient documentation

## 2017-07-08 MED ORDER — GABAPENTIN 300 MG PO CAPS: 300.0000 mg | ORAL_CAPSULE | Freq: Every day | ORAL | 3 refills | Status: DC

## 2017-07-08 NOTE — Progress Notes (Signed)
.   BP (!) 123/92   Pulse 94   Temp 98.5 F (36.9 C) (Oral)   Ht 5' 5"  (1.651 m)   Wt (!) 350 lb 12.8 oz (159.1 kg)   BMI 58.38 kg/m    Subjective:    Patient ID: Marissa [name], female    DOB: 1966/05/05, 51 y.o.   MRN: [id number]  HPI: Marissa [name] is a 51 y.o. female presenting on 07/08/2017 for New Patient (Initial Visit) and Establish Care  This patient comes in today to be established with our practice.  She is currently followed by pulmonary Dr. Vaughan Browner, cardiology Dr. Harl Bowie, and orthopedics in [place].  She has a diagnosis of COPD with emphysema, congestive heart failure, sleep apnea, oxygen dependency at night.  We have discussed smoking cessation today.  She has reduced her smoking and is willing to keep working on this.  She is also having lots of joint pain and myalgias.  The pain will hurt throughout her body.  The worst pain right now is in her left hip left low back and down her left leg.  She states it feels weak at times but she has not fallen.  There is been no specific injury to this.  She reports that she has had degenerative disc disease reported to her in the past.  We had a conversation about gabapentin as a medicine to help with nerve pain.  We can try to progress her up on this medication.  She reports that she is also going through menopause.  Her cycles have been very heavy at times there is concern for anemia.  We will do a blood workup today.  Relevant past medical, surgical, family and social history reviewed and updated as indicated. Allergies and medications reviewed and updated.  Past Medical History:  Diagnosis Date  . Arthritis   . Asthma   . Atelectasis pulmonary   . Back pain   . CHF (congestive heart failure) ([facility])    11/2016  . Chronic back pain   . COPD (chronic obstructive pulmonary disease) ([facility])   . Depression   . Depression with anxiety   . Dyspnea   . Emphysema lung ([facility])   . Emphysema lung ([facility])   . Fibromyalgia   . GERD (gastroesophageal  reflux disease)   . Headache(784.0)    HX MIGRAINES  . Hypercholesterolemia   . Hypertension   . Insomnia   . Morbid obesity ([facility])   . Pneumonia    MARCH   2018  . Requires supplemental oxygen    3 LITERS AT HS  . Sleep apnea    BIPAP +  O2    Past Surgical History:  Procedure Laterality Date  . CESAREAN SECTION    . CHOLECYSTECTOMY    . FOOT SURGERY    . GALLBLADDER SURGERY    . TONSILLECTOMY      Review of Systems  Constitutional: Positive for fatigue. Negative for activity change and fever.  HENT: Negative.   Eyes: Negative.   Respiratory: Negative.  Negative for cough.   Cardiovascular: Negative.  Negative for chest pain.  Gastrointestinal: Negative.  Negative for abdominal pain.  Endocrine: Negative.   Genitourinary: Negative.  Negative for dysuria.  Musculoskeletal: Positive for arthralgias, back pain and myalgias.  Skin: Negative.   Neurological: Positive for weakness. Negative for numbness.  Psychiatric/Behavioral: Positive for dysphoric mood and sleep disturbance. The patient is nervous/anxious.     Allergies as of 07/08/2017      Reactions  Advera [alitraq] Itching   Throat Itching   Relpax [eletriptan] Other (See Comments)   Weakness, couldn't move   Citalopram    Morphine And Related Itching      Medication List        Accurate as of 07/08/17 11:10 AM. Always use your most recent med list.          albuterol (2.5 MG/3ML) 0.083% nebulizer solution Commonly known as:  PROVENTIL Take 2.5 mg by nebulization every 6 (six) hours as needed for wheezing or shortness of breath.   albuterol 108 (90 Base) MCG/ACT inhaler Commonly known as:  PROVENTIL HFA;VENTOLIN HFA Inhale 2 puffs into the lungs every 4 (four) hours as needed for wheezing or shortness of breath.   ALPRAZolam 1 MG tablet Commonly known as:  XANAX Take 1 mg by mouth 3 (three) times daily.   furosemide 20 MG tablet Commonly known as:  LASIX TAKE (1) TABLET BY MOUTH ONCE DAILY AS  NEEDED.   gabapentin 300 MG capsule Commonly known as:  NEURONTIN Take 1-3 capsules (300-900 mg total) at bedtime by mouth.   ibuprofen 200 MG tablet Commonly known as:  ADVIL,MOTRIN Take 800 mg by mouth every 8 (eight) hours as needed for mild pain or moderate pain.   LATUDA 60 MG Tabs Generic drug:  Lurasidone HCl Take 60 mg daily by mouth.   lisinopril 10 MG tablet Commonly known as:  PRINIVIL,ZESTRIL Take 10 mg by mouth 2 (two) times daily.   multivitamin with minerals tablet Take 1 tablet by mouth daily.   NON FORMULARY Pt uses CPAP machine at bedtime   OXYGEN Inhale 3 L into the lungs at bedtime.   Potassium 99 MG Tabs Take 1 tablet by mouth every other day.   SYMBICORT 160-4.5 MCG/ACT inhaler Generic drug:  budesonide-formoterol Inhale 2 puffs into the lungs 2 (two) times daily.   ZYRTEC ALLERGY 10 MG Caps Generic drug:  Cetirizine HCl Take 10 mg by mouth daily.          Objective:    BP (!) 123/92   Pulse 94   Temp 98.5 F (36.9 C) (Oral)   Ht 5' 5"  (1.651 m)   Wt (!) 350 lb 12.8 oz (159.1 kg)   BMI 58.38 kg/m   Allergies  Allergen Reactions  . Advera [Alitraq] Itching    Throat Itching  . Relpax [Eletriptan] Other (See Comments)    Weakness, couldn't move  . Citalopram   . Morphine And Related Itching    Physical Exam  Constitutional: She is oriented to person, place, and time. She appears well-developed and well-nourished. No distress.  HENT:  Head: Normocephalic and atraumatic.  Right Ear: Tympanic membrane, external ear and ear canal normal.  Left Ear: Tympanic membrane, external ear and ear canal normal.  Nose: Nose normal. No rhinorrhea.  Mouth/Throat: Oropharynx is clear and moist and mucous membranes are normal. No oropharyngeal exudate or posterior oropharyngeal erythema.  Eyes: Conjunctivae and EOM are normal. Pupils are equal, round, and reactive to light.  Neck: Normal range of motion. Neck supple.  Cardiovascular: Normal  rate, regular rhythm, normal heart sounds and intact distal pulses.  Pulmonary/Chest: Effort normal and breath sounds normal.  Abdominal: Soft. Bowel sounds are normal.  Musculoskeletal:       Left hip: She exhibits normal range of motion, no tenderness and no deformity.       Legs: Neurological: She is alert and oriented to person, place, and time. She has normal reflexes.  Skin:  Skin is warm and dry. No rash noted. She is not diaphoretic.  Psychiatric: She has a normal mood and affect. Her behavior is normal. Judgment and thought content normal.    Results for orders placed or performed in visit on 04/24/17  Pulmonary function test  Result Value Ref Range   FVC-Pre 2.37 L   FVC-%Pred-Pre 64 %   FVC-Post 2.67 L   FVC-%Pred-Post 72 %   FVC-%Change-Post 12 %   FEV1-Pre 1.62 L   FEV1-%Pred-Pre 55 %   FEV1-Post 1.86 L   FEV1-%Pred-Post 64 %   FEV1-%Change-Post 14 %   FEV6-Pre 2.37 L   FEV6-%Pred-Pre 66 %   FEV6-Post 2.67 L   FEV6-%Pred-Post 74 %   FEV6-%Change-Post 12 %   Pre FEV1/FVC ratio 68 %   FEV1FVC-%Pred-Pre 85 %   Post FEV1/FVC ratio 70 %   FEV1FVC-%Change-Post 1 %   Pre FEV6/FVC Ratio 100 %   FEV6FVC-%Pred-Pre 102 %   Post FEV6/FVC ratio 100 %   FEV6FVC-%Pred-Post 102 %   FEF 25-75 Pre 1.12 L/sec   FEF2575-%Pred-Pre 39 %   FEF 25-75 Post 1.71 L/sec   FEF2575-%Pred-Post 60 %   FEF2575-%Change-Post 52 %   RV 2.98 L   RV % pred 162 %   TLC 5.58 L   TLC % pred 107 %   DLCO unc 22.46 ml/min/mmHg   DLCO unc % pred 87 %   DLCO cor 22.46 ml/min/mmHg   DLCO cor % pred 87 %   DL/VA 4.66 ml/min/mmHg/L   DL/VA % pred 94 %      Assessment & Plan:   1. Pain of left hip joint - Ambulatory referral to Orthopedic Surgery  2. Myalgia  3. Pulmonary emphysema, unspecified emphysema type ([facility]) - CBC with Differential/Platelet - CMP14+EGFR - Lipid panel - Thyroid Panel With TSH  4. Chronic congestive heart failure, unspecified heart failure type ([facility]) - CBC with  Differential/Platelet - CMP14+EGFR - Lipid panel - Thyroid Panel With TSH  5. Encounter for smoking cessation counseling  6. Essential hypertension  7. Morbid obesity ([facility])    Current Outpatient Medications:  .  albuterol (PROVENTIL HFA;VENTOLIN HFA) 108 (90 BASE) MCG/ACT inhaler, Inhale 2 puffs into the lungs every 4 (four) hours as needed for wheezing or shortness of breath., Disp: 1 Inhaler, Rfl: 2 .  ALPRAZolam (XANAX) 1 MG tablet, Take 1 mg by mouth 3 (three) times daily. , Disp: , Rfl:  .  Cetirizine HCl (ZYRTEC ALLERGY) 10 MG CAPS, Take 10 mg by mouth daily. , Disp: , Rfl:  .  furosemide (LASIX) 20 MG tablet, TAKE (1) TABLET BY MOUTH ONCE DAILY AS NEEDED., Disp: 30 tablet, Rfl: 0 .  ibuprofen (ADVIL,MOTRIN) 200 MG tablet, Take 800 mg by mouth every 8 (eight) hours as needed for mild pain or moderate pain., Disp: , Rfl:  .  lisinopril (PRINIVIL,ZESTRIL) 10 MG tablet, Take 10 mg by mouth 2 (two) times daily., Disp: , Rfl:  .  Lurasidone HCl (LATUDA) 60 MG TABS, Take 60 mg daily by mouth., Disp: , Rfl:  .  Multiple Vitamins-Minerals (MULTIVITAMIN WITH MINERALS) tablet, Take 1 tablet by mouth daily., Disp: , Rfl:  .  NON FORMULARY, Pt uses CPAP machine at bedtime, Disp: , Rfl:  .  OXYGEN, Inhale 3 L into the lungs at bedtime., Disp: , Rfl:  .  Potassium 99 MG TABS, Take 1 tablet by mouth every other day., Disp: , Rfl:  .  SYMBICORT 160-4.5 MCG/ACT inhaler, Inhale 2 puffs  into the lungs 2 (two) times daily., Disp: , Rfl: 4 .  albuterol (PROVENTIL) (2.5 MG/3ML) 0.083% nebulizer solution, Take 2.5 mg by nebulization every 6 (six) hours as needed for wheezing or shortness of breath., Disp: , Rfl:  .  gabapentin (NEURONTIN) 300 MG capsule, Take 1-3 capsules (300-900 mg total) at bedtime by mouth., Disp: 90 capsule, Rfl: 3 Continue all other maintenance medications as listed above.  Follow up plan: Return in about 4 weeks (around 08/05/2017) for recheck.  Educational handout given for  [place] PA-C [place] [address]  [place], [place] [address] [phone]   07/08/2017, 11:10 AM

## 2017-07-08 NOTE — Patient Instructions (Addendum)
Magnesium 400  Mg daily   Gabapentin 300 mg at bed, can increase by one capsule daily as long as you are not sleepy  In a few days you may receive a survey in the mail or online from [employer] regarding your visit with us today. Please take a moment to fill this out. Your feedback is very important to our whole office. It can help us better understand your needs as well as improve your experience and satisfaction. Thank you for taking your time to complete it. We care about you.  Prudy FeelerAngel Issaic Welliver, PA-C

## 2017-07-09 LAB — CBC WITH DIFFERENTIAL/PLATELET
BASOS ABS: 0.1 10*3/uL (ref 0.0–0.2)
Basos: 1 %
EOS (ABSOLUTE): 0.2 10*3/uL (ref 0.0–0.4)
Eos: 2 %
HEMOGLOBIN: 15.4 g/dL (ref 11.1–15.9)
Hematocrit: 46.8 % — ABNORMAL HIGH (ref 34.0–46.6)
IMMATURE GRANS (ABS): 0 10*3/uL (ref 0.0–0.1)
Immature Granulocytes: 0 %
LYMPHS: 26 %
Lymphocytes Absolute: 2.5 10*3/uL (ref 0.7–3.1)
MCH: 33.6 pg — ABNORMAL HIGH (ref 26.6–33.0)
MCHC: 32.9 g/dL (ref 31.5–35.7)
MCV: 102 fL — ABNORMAL HIGH (ref 79–97)
Monocytes Absolute: 0.7 10*3/uL (ref 0.1–0.9)
Monocytes: 8 %
NEUTROS PCT: 63 %
Neutrophils Absolute: 6.1 10*3/uL (ref 1.4–7.0)
PLATELETS: 241 10*3/uL (ref 150–379)
RBC: 4.58 x10E6/uL (ref 3.77–5.28)
RDW: 13.6 % (ref 12.3–15.4)
WBC: 9.6 10*3/uL (ref 3.4–10.8)

## 2017-07-09 LAB — CMP14+EGFR
ALBUMIN: 4.2 g/dL (ref 3.5–5.5)
ALK PHOS: 95 IU/L (ref 39–117)
ALT: 14 IU/L (ref 0–32)
AST: 15 IU/L (ref 0–40)
Albumin/Globulin Ratio: 1.8 (ref 1.2–2.2)
BUN / CREAT RATIO: 14 (ref 9–23)
BUN: 10 mg/dL (ref 6–24)
Bilirubin Total: 0.3 mg/dL (ref 0.0–1.2)
CO2: 27 mmol/L (ref 20–29)
CREATININE: 0.7 mg/dL (ref 0.57–1.00)
Calcium: 8.8 mg/dL (ref 8.7–10.2)
Chloride: 101 mmol/L (ref 96–106)
GFR calc non Af Amer: 101 mL/min/{1.73_m2} (ref >59)
GFR, EST AFRICAN AMERICAN: 117 mL/min/{1.73_m2} (ref >59)
GLUCOSE: 132 mg/dL — AB (ref 65–99)
Globulin, Total: 2.4 g/dL (ref 1.5–4.5)
Potassium: 4.4 mmol/L (ref 3.5–5.2)
Sodium: 142 mmol/L (ref 134–144)
TOTAL PROTEIN: 6.6 g/dL (ref 6.0–8.5)

## 2017-07-09 LAB — THYROID PANEL WITH TSH
Free Thyroxine Index: 1.8 (ref 1.2–4.9)
T3 Uptake Ratio: 26 % (ref 24–39)
T4 TOTAL: 7 ug/dL (ref 4.5–12.0)
TSH: 3.15 u[IU]/mL (ref 0.450–4.500)

## 2017-07-09 LAB — LIPID PANEL
Chol/HDL Ratio: 4.7 ratio — ABNORMAL HIGH (ref 0.0–4.4)
Cholesterol, Total: 227 mg/dL — ABNORMAL HIGH (ref 100–199)
HDL: 48 mg/dL (ref >39)
LDL CALC: 151 mg/dL — AB (ref 0–99)
Triglycerides: 138 mg/dL (ref 0–149)
VLDL CHOLESTEROL CAL: 28 mg/dL (ref 5–40)

## 2017-07-30 ENCOUNTER — Ambulatory Visit (INDEPENDENT_AMBULATORY_CARE_PROVIDER_SITE_OTHER): Payer: Self-pay

## 2017-07-30 ENCOUNTER — Ambulatory Visit (INDEPENDENT_AMBULATORY_CARE_PROVIDER_SITE_OTHER): Payer: Medicaid Other | Admitting: Orthopaedic Surgery

## 2017-07-30 ENCOUNTER — Encounter (INDEPENDENT_AMBULATORY_CARE_PROVIDER_SITE_OTHER): Payer: Self-pay | Admitting: Orthopaedic Surgery

## 2017-07-30 VITALS — BP 139/88 | HR 80 | Ht 65.0 in | Wt 351.0 lb

## 2017-07-30 DIAGNOSIS — M7062 Trochanteric bursitis, left hip: Secondary | ICD-10-CM | POA: Diagnosis not present

## 2017-07-30 DIAGNOSIS — M545 Low back pain: Principal | ICD-10-CM

## 2017-07-30 DIAGNOSIS — M25552 Pain in left hip: Secondary | ICD-10-CM

## 2017-07-30 DIAGNOSIS — M75122 Complete rotator cuff tear or rupture of left shoulder, not specified as traumatic: Secondary | ICD-10-CM

## 2017-07-30 DIAGNOSIS — G8929 Other chronic pain: Secondary | ICD-10-CM

## 2017-07-30 NOTE — Progress Notes (Addendum)
Office Visit Note   Patient: Marissa BoutonRegina Macquarrie           Date of Birth: [birth year]           MRN: [id number] Visit Date: 07/30/2017              Requested by: Remus LofflerJones, Angel S, PA-C [address] [place], [name] [id number] PCP: Remus LofflerJones, Angel S, PA-C   Assessment & Plan: Visit Diagnoses:  1. Chronic left-sided low back pain, with sciatica presence unspecified   2. Pain in left hip   3. Trochanteric bursitis, left hip   4. Complete tear of left rotator cuff     Plan: left  Trochanteric injection performed.  We will follow-up with her after the holidays.  Follow-Up Instructions: Return in about 6 weeks (around 09/10/2017).   Orders:  Orders Placed This Encounter  Procedures  . XR HIP UNILAT W OR W/O PELVIS 2-3 VIEWS LEFT  . XR Lumbar Spine 2-3 Views   No orders of the defined types were placed in this encounter.     Procedures: Large Joint Inj: L greater trochanter on 07/31/2017 1:32 PM Details: lateral approach Medications: 2 mL bupivacaine 0.25 %; 40 mg methylPREDNISolone acetate 40 MG/ML      Clinical Data: No additional findings.   Subjective: Chief Complaint  Patient presents with  . Lower Back - Pain  . Left Hip - Pain    HPI old female not seen about 3 years with history of some disc degeneration L4-5 and L5-S1 is seen with left hip pain.  She previously had significant problems on her right lower extremity several years ago and now is having problems with pain laterally over the trochanter that radiates down about 8 cm below the knee.  She is walking with a limp.  She is noticed some popping in her hip.  Previous ESI in the distant past.  She states she has had to catch herself several times to avoid falling she is not able to lay on the left side.  She denies associated bowel or bladder symptoms she is used Tylenol without relief.  She denies fever or chills.  Review of Systems for multiple problems including migraines, neck pain, inability to lift left shoulder above  90 degrees, positive for smoker 30-pack-year history, emphysema, chronic congestive heart failure, hypertension, patient reports fibromyalgia.  Lumbar disc degeneration otherwise negative as it pertains HPI 14 point review of systems.   Objective: Vital Signs: BP 139/88   Pulse 80   Ht 5\' 5"  (1.651 m)   Wt (!) 351 lb (159.2 kg)   BMI 58.41 kg/m   Physical Exam  Constitutional: She is oriented to person, place, and time. She appears well-developed.  HENT:  Head: Normocephalic.  Right Ear: External ear normal.  Left Ear: External ear normal.  Eyes: Pupils are equal, round, and reactive to light.  Neck: No tracheal deviation present. No thyromegaly present.  Cardiovascular: Normal rate.  Pulmonary/Chest: Effort normal.  Abdominal: Soft.  Morbidly obese liver and spleen are not palpable.  Neurological: She is alert and oriented to person, place, and time.  Skin: Skin is warm and dry.  Psychiatric: She has a normal mood and affect. Her behavior is normal.    Ortho Exam patient has pain with 20 degrees external/internal rotation of her left hip.  She is able to cross her leg.  Exquisite tenderness of the left trochanter mild sciatic notch tenderness.  Some pain with straight leg raising 90 degrees.  Anterior tib EHL is intact.  She ambulates with a mild Trendelenburg gait.  Crepitus with knee range of motion left knee tenderness more lateral than medial tenderness over the distal iliotibial band.  Palpable pedal pulse.  Impingement right shoulder positive impingement left shoulder she can only abduct to 90 degrees with significant supraspinatus weakness.  Brachial plexus tenderness pain with rotation right and left.  Specialty Comments:  No specialty comments available.  Imaging: Xr Hip Unilat W Or W/o Pelvis 2-3 Views Left  Result Date: 07/30/2017 Multiple views pelvis and hips obtained including frog-leg lateral AP pelvis.  This shows mild hip uncoverage both right and left.  Negative  for acute fracture.  Mild joint space narrowing.  Impression: Negative for acute changes slight hip space narrowing right and left.  Xr Lumbar Spine 2-3 Views  Result Date: 07/30/2017 AP lateral and spot L5-S1 lateral x-rays obtained.  This shows left lateral osteophytes at L4-5 mild narrowing at 4551 with spurring and facet degenerative changes without anterolisthesis. Impression: Lumbar disc degeneration with spurring and minimal disc space narrowing.    PMFS History: Patient Active Problem List   Diagnosis Date Noted  . Joint pain 07/08/2017  . Myalgia 07/08/2017  . Pulmonary emphysema ([facility]) 07/08/2017  . Chronic congestive heart failure ([facility]) 07/08/2017  . Encounter for smoking cessation counseling 07/08/2017  . Essential hypertension 07/08/2017  . Migraine without aura, with intractable migraine, so stated, without mention of status migrainosus 06/21/2013  . Neck pain 06/21/2013  . Morbid obesity ([facility]) 06/21/2013   Past Medical History:  Diagnosis Date  . Arthritis   . Asthma   . Atelectasis pulmonary   . Back pain   . CHF (congestive heart failure) ([facility])    11/2016  . Chronic back pain   . COPD (chronic obstructive pulmonary disease) ([facility])   . Depression   . Depression with anxiety   . Dyspnea   . Emphysema lung ([facility])   . Emphysema lung ([facility])   . Fibromyalgia   . GERD (gastroesophageal reflux disease)   . Headache(784.0)    HX MIGRAINES  . Hypercholesterolemia   . Hypertension   . Insomnia   . Morbid obesity ([facility])   . Pneumonia    MARCH   2018  . Requires supplemental oxygen    3 LITERS AT HS  . Sleep apnea    BIPAP +  O2    Family History  Problem Relation Age of Onset  . Congestive Heart Failure Mother   . Hypertension Mother     Past Surgical History:  Procedure Laterality Date  . CESAREAN SECTION    . CHOLECYSTECTOMY    . FOOT SURGERY    . GALLBLADDER SURGERY    . TONSILLECTOMY    . TOOTH EXTRACTION N/A 01/30/2017   Procedure: EXTRACTION TEETH  15, 17, 31;  Surgeon: Ocie DoyneJensen, Scott, DDS;  Location: [facility] OR;  Service: Oral Surgery;  Laterality: N/A;   Social History   Occupational History    Employer: OTHER    Comment: n/a  Tobacco Use  . Smoking status: Current Every Day Smoker    Packs/day: 0.25    Years: 35.00    Pack years: 8.75    Types: Cigarettes  . Smokeless tobacco: Never Used  . Tobacco comment: 3 Cigarettes daily-01/27/17  Substance and Sexual Activity  . Alcohol use: No  . Drug use: No  . Sexual activity: Yes

## 2017-07-31 ENCOUNTER — Encounter (INDEPENDENT_AMBULATORY_CARE_PROVIDER_SITE_OTHER): Payer: Self-pay | Admitting: Orthopaedic Surgery

## 2017-07-31 DIAGNOSIS — M7062 Trochanteric bursitis, left hip: Secondary | ICD-10-CM | POA: Diagnosis not present

## 2017-07-31 MED ORDER — BUPIVACAINE HCL 0.25 % IJ SOLN
2.0000 mL | INTRAMUSCULAR | Status: AC | PRN
  Administered 2017-07-31: 2 mL via INTRA_ARTICULAR

## 2017-07-31 MED ORDER — METHYLPREDNISOLONE ACETATE 40 MG/ML IJ SUSP
40.0000 mg | INTRAMUSCULAR | Status: AC | PRN
  Administered 2017-07-31: 40 mg via INTRA_ARTICULAR

## 2017-08-05 ENCOUNTER — Ambulatory Visit: Payer: Medicaid Other | Admitting: Physician Assistant

## 2017-08-07 ENCOUNTER — Encounter: Payer: Self-pay | Admitting: Physician Assistant

## 2017-08-07 ENCOUNTER — Ambulatory Visit: Payer: Medicaid Other | Admitting: Physician Assistant

## 2017-08-07 VITALS — BP 143/97 | HR 91 | Temp 98.2°F | Ht 65.0 in | Wt 352.0 lb

## 2017-08-07 DIAGNOSIS — I10 Essential (primary) hypertension: Secondary | ICD-10-CM | POA: Diagnosis not present

## 2017-08-07 DIAGNOSIS — M25511 Pain in right shoulder: Secondary | ICD-10-CM | POA: Diagnosis not present

## 2017-08-07 DIAGNOSIS — J439 Emphysema, unspecified: Secondary | ICD-10-CM

## 2017-08-07 DIAGNOSIS — M542 Cervicalgia: Secondary | ICD-10-CM | POA: Diagnosis not present

## 2017-08-07 DIAGNOSIS — M25552 Pain in left hip: Secondary | ICD-10-CM | POA: Diagnosis not present

## 2017-08-07 DIAGNOSIS — G8929 Other chronic pain: Secondary | ICD-10-CM

## 2017-08-07 MED ORDER — LISINOPRIL 10 MG PO TABS: 10.0000 mg | ORAL_TABLET | Freq: Two times a day (BID) | ORAL | 11 refills | Status: DC

## 2017-08-07 MED ORDER — VITAMIN D (ERGOCALCIFEROL) 1.25 MG (50000 UNIT) PO CAPS: 50000.0000 [IU] | ORAL_CAPSULE | ORAL | 11 refills | Status: AC

## 2017-08-07 MED ORDER — HYDROCODONE-ACETAMINOPHEN 10-325 MG PO TABS: 1.0000 | ORAL_TABLET | ORAL | 0 refills | Status: DC | PRN

## 2017-08-07 MED ORDER — FUROSEMIDE 20 MG PO TABS: ORAL_TABLET | ORAL | 11 refills | Status: DC

## 2017-08-07 NOTE — Patient Instructions (Signed)
In a few days you may receive a survey in the mail or online from [employer] regarding your visit with us today. Please take a moment to fill this out. Your feedback is very important to our whole office. It can help us better understand your needs as well as improve your experience and satisfaction. Thank you for taking your time to complete it. We care about you.  Mikeisha Lemonds, PA-C  

## 2017-08-10 NOTE — Progress Notes (Signed)
BP (!) 143/97   Pulse 91   Temp 98.2 F (36.8 C) (Oral)   Ht '5\' 5"'$  (1.651 m)   Wt (!) 352 lb (159.7 kg)   BMI 58.58 kg/m    Subjective:    Patient ID: Marissa [name], female    DOB: 08-01-1966, 51 y.o.   MRN: [id number]  HPI: Ambri [name] is a 51 y.o. female presenting on 08/07/2017 for Follow-up (4 week rck )  This patient comes in for 4-week follow-up.  She was a new patient at that time with multiple medical problems.  At that time we had worked on her hypertension, multiple joint pain.  She is chronically had pain of the left hip.  She does see orthopedics for this.  Her blood pressure is much better and her pulse is staying in the 70s.  She is having a little bit of soreness in her right upper chest.  It is made worse by lifting and stretching.  She denies any shortness of breath or diaphoresis.  Relevant past medical, surgical, family and social history reviewed and updated as indicated. Allergies and medications reviewed and updated.  Past Medical History:  Diagnosis Date  . Arthritis   . Asthma   . Atelectasis pulmonary   . Back pain   . CHF (congestive heart failure) ([place])    11/2016  . Chronic back pain   . COPD (chronic obstructive pulmonary disease) ([place])   . Depression   . Depression with anxiety   . Dyspnea   . Emphysema lung ([place])   . Emphysema lung ([place])   . Fibromyalgia   . GERD (gastroesophageal reflux disease)   . Headache(784.0)    HX MIGRAINES  . Hypercholesterolemia   . Hypertension   . Insomnia   . Morbid obesity ([place])   . Pneumonia    MARCH   2018  . Requires supplemental oxygen    3 LITERS AT HS  . Sleep apnea    BIPAP +  O2    Past Surgical History:  Procedure Laterality Date  . CESAREAN SECTION    . CHOLECYSTECTOMY    . FOOT SURGERY    . GALLBLADDER SURGERY    . TONSILLECTOMY    . TOOTH EXTRACTION N/A 01/30/2017   Procedure: EXTRACTION TEETH 15, 17, 31;  Surgeon: Diona Browner, DDS;  Location: [facility];  Service: Oral Surgery;   Laterality: N/A;    Review of Systems  Constitutional: Negative.  Negative for activity change, fatigue and fever.  HENT: Negative.   Eyes: Negative.   Respiratory: Negative.  Negative for cough.   Cardiovascular: Negative.  Negative for chest pain.  Gastrointestinal: Negative.  Negative for abdominal pain.  Endocrine: Negative.   Genitourinary: Negative.  Negative for dysuria.  Musculoskeletal: Positive for arthralgias, back pain and myalgias.  Skin: Negative.   Neurological: Negative.     Allergies as of 08/07/2017      Reactions   Advera [compleat] Itching   Throat Itching   Relpax [eletriptan] Other (See Comments)   Weakness, couldn't move   Citalopram    Morphine And Related Itching      Medication List        Accurate as of 08/07/17 11:59 PM. Always use your most recent med list.          albuterol (2.5 MG/3ML) 0.083% nebulizer solution Commonly known as:  PROVENTIL Take 2.5 mg by nebulization every 6 (six) hours as needed for wheezing or shortness of breath.   albuterol 108 (  90 Base) MCG/ACT inhaler Commonly known as:  PROVENTIL HFA;VENTOLIN HFA Inhale 2 puffs into the lungs every 4 (four) hours as needed for wheezing or shortness of breath.   ALPRAZolam 1 MG tablet Commonly known as:  XANAX Take 1 mg by mouth 3 (three) times daily.   furosemide 20 MG tablet Commonly known as:  LASIX TAKE (1) TABLET BY MOUTH ONCE DAILY AS NEEDED.   gabapentin 300 MG capsule Commonly known as:  NEURONTIN Take 1-3 capsules (300-900 mg total) at bedtime by mouth.   HYDROcodone-acetaminophen 10-325 MG tablet Commonly known as:  NORCO Take 1 tablet by mouth every 4 (four) hours as needed.   ibuprofen 200 MG tablet Commonly known as:  ADVIL,MOTRIN Take 800 mg by mouth every 8 (eight) hours as needed for mild pain or moderate pain.   LATUDA 60 MG Tabs Generic drug:  Lurasidone HCl Take 60 mg daily by mouth.   lisinopril 10 MG tablet Commonly known as:   PRINIVIL,ZESTRIL Take 1 tablet (10 mg total) by mouth 2 (two) times daily.   multivitamin with minerals tablet Take 1 tablet by mouth daily.   NON FORMULARY Pt uses CPAP machine at bedtime   OXYGEN Inhale 3 L into the lungs at bedtime.   Potassium 99 MG Tabs Take 1 tablet by mouth every other day.   SYMBICORT 160-4.5 MCG/ACT inhaler Generic drug:  budesonide-formoterol Inhale 2 puffs into the lungs 2 (two) times daily.   Vitamin D (Ergocalciferol) 50000 units Caps capsule Commonly known as:  DRISDOL Take 1 capsule (50,000 Units total) by mouth every 7 (seven) days.   ZYRTEC ALLERGY 10 MG Caps Generic drug:  Cetirizine HCl Take 10 mg by mouth daily.          Objective:    BP (!) 143/97   Pulse 91   Temp 98.2 F (36.8 C) (Oral)   Ht '5\' 5"'$  (1.651 m)   Wt (!) 352 lb (159.7 kg)   BMI 58.58 kg/m   Allergies  Allergen Reactions  . Advera [Compleat] Itching    Throat Itching  . Relpax [Eletriptan] Other (See Comments)    Weakness, couldn't move  . Citalopram   . Morphine And Related Itching    Physical Exam  Constitutional: She is oriented to person, place, and time. She appears well-developed and well-nourished.  HENT:  Head: Normocephalic and atraumatic.  Right Ear: Tympanic membrane, external ear and ear canal normal.  Left Ear: Tympanic membrane, external ear and ear canal normal.  Nose: Nose normal. No rhinorrhea.  Mouth/Throat: Oropharynx is clear and moist and mucous membranes are normal. No oropharyngeal exudate or posterior oropharyngeal erythema.  Eyes: Conjunctivae and EOM are normal. Pupils are equal, round, and reactive to light.  Neck: Normal range of motion. Neck supple.  Cardiovascular: Normal rate, regular rhythm, normal heart sounds and intact distal pulses.  Pulmonary/Chest: Effort normal and breath sounds normal.  Abdominal: Soft. Bowel sounds are normal.  Musculoskeletal:       Left shoulder: She exhibits decreased range of motion and  tenderness. She exhibits no swelling and no deformity.  Neurological: She is alert and oriented to person, place, and time. She has normal reflexes.  Skin: Skin is warm and dry. No rash noted.  Psychiatric: She has a normal mood and affect. Her behavior is normal. Judgment and thought content normal.    Results for orders placed or performed in visit on 07/08/17  CBC with Differential/Platelet  Result Value Ref Range   WBC 9.6  3.4 - 10.8 x10E3/uL   RBC 4.58 3.77 - 5.28 x10E6/uL   Hemoglobin 15.4 11.1 - 15.9 g/dL   Hematocrit 46.8 (H) 34.0 - 46.6 %   MCV 102 (H) 79 - 97 fL   MCH 33.6 (H) 26.6 - 33.0 pg   MCHC 32.9 31.5 - 35.7 g/dL   RDW 13.6 12.3 - 15.4 %   Platelets 241 150 - 379 x10E3/uL   Neutrophils 63 Not Estab. %   Lymphs 26 Not Estab. %   Monocytes 8 Not Estab. %   Eos 2 Not Estab. %   Basos 1 Not Estab. %   Neutrophils Absolute 6.1 1.4 - 7.0 x10E3/uL   Lymphocytes Absolute 2.5 0.7 - 3.1 x10E3/uL   Monocytes Absolute 0.7 0.1 - 0.9 x10E3/uL   EOS (ABSOLUTE) 0.2 0.0 - 0.4 x10E3/uL   Basophils Absolute 0.1 0.0 - 0.2 x10E3/uL   Immature Granulocytes 0 Not Estab. %   Immature Grans (Abs) 0.0 0.0 - 0.1 x10E3/uL  CMP14+EGFR  Result Value Ref Range   Glucose 132 (H) 65 - 99 mg/dL   BUN 10 6 - 24 mg/dL   Creatinine, Ser 0.70 0.57 - 1.00 mg/dL   GFR calc non Af Amer 101 >59 mL/min/1.73   GFR calc Af Amer 117 >59 mL/min/1.73   BUN/Creatinine Ratio 14 9 - 23   Sodium 142 134 - 144 mmol/L   Potassium 4.4 3.5 - 5.2 mmol/L   Chloride 101 96 - 106 mmol/L   CO2 27 20 - 29 mmol/L   Calcium 8.8 8.7 - 10.2 mg/dL   Total Protein 6.6 6.0 - 8.5 g/dL   Albumin 4.2 3.5 - 5.5 g/dL   Globulin, Total 2.4 1.5 - 4.5 g/dL   Albumin/Globulin Ratio 1.8 1.2 - 2.2   Bilirubin Total 0.3 0.0 - 1.2 mg/dL   Alkaline Phosphatase 95 39 - 117 IU/L   AST 15 0 - 40 IU/L   ALT 14 0 - 32 IU/L  Lipid panel  Result Value Ref Range   Cholesterol, Total 227 (H) 100 - 199 mg/dL   Triglycerides 138 0 - 149  mg/dL   HDL 48 >39 mg/dL   VLDL Cholesterol Cal 28 5 - 40 mg/dL   LDL Calculated 151 (H) 0 - 99 mg/dL   Chol/HDL Ratio 4.7 (H) 0.0 - 4.4 ratio  Thyroid Panel With TSH  Result Value Ref Range   TSH 3.150 0.450 - 4.500 uIU/mL   T4, Total 7.0 4.5 - 12.0 ug/dL   T3 Uptake Ratio 26 24 - 39 %   Free Thyroxine Index 1.8 1.2 - 4.9      Assessment & Plan:   1. Chronic right shoulder pain - Ambulatory referral to Orthopedic Surgery - HYDROcodone-acetaminophen (NORCO) 10-325 MG tablet; Take 1 tablet by mouth every 4 (four) hours as needed.  Dispense: 30 tablet; Refill: 0  2. Essential hypertension - furosemide (LASIX) 20 MG tablet; TAKE (1) TABLET BY MOUTH ONCE DAILY AS NEEDED.  Dispense: 30 tablet; Refill: 11 - lisinopril (PRINIVIL,ZESTRIL) 10 MG tablet; Take 1 tablet (10 mg total) by mouth 2 (two) times daily.  Dispense: 30 tablet; Refill: 11  3. Pulmonary emphysema, unspecified emphysema type ([place])  4. Neck pain  5. Pain of left hip joint    Current Outpatient Medications:  .  albuterol (PROVENTIL HFA;VENTOLIN HFA) 108 (90 BASE) MCG/ACT inhaler, Inhale 2 puffs into the lungs every 4 (four) hours as needed for wheezing or shortness of breath., Disp: 1 Inhaler, Rfl: 2 .  albuterol (PROVENTIL) (2.5 MG/3ML) 0.083% nebulizer solution, Take 2.5 mg by nebulization every 6 (six) hours as needed for wheezing or shortness of breath., Disp: , Rfl:  .  ALPRAZolam (XANAX) 1 MG tablet, Take 1 mg by mouth 3 (three) times daily. , Disp: , Rfl:  .  Cetirizine HCl (ZYRTEC ALLERGY) 10 MG CAPS, Take 10 mg by mouth daily. , Disp: , Rfl:  .  furosemide (LASIX) 20 MG tablet, TAKE (1) TABLET BY MOUTH ONCE DAILY AS NEEDED., Disp: 30 tablet, Rfl: 11 .  gabapentin (NEURONTIN) 300 MG capsule, Take 1-3 capsules (300-900 mg total) at bedtime by mouth., Disp: 90 capsule, Rfl: 3 .  HYDROcodone-acetaminophen (NORCO) 10-325 MG tablet, Take 1 tablet by mouth every 4 (four) hours as needed., Disp: 30 tablet, Rfl: 0 .   ibuprofen (ADVIL,MOTRIN) 200 MG tablet, Take 800 mg by mouth every 8 (eight) hours as needed for mild pain or moderate pain., Disp: , Rfl:  .  lisinopril (PRINIVIL,ZESTRIL) 10 MG tablet, Take 1 tablet (10 mg total) by mouth 2 (two) times daily., Disp: 30 tablet, Rfl: 11 .  Lurasidone HCl (LATUDA) 60 MG TABS, Take 60 mg daily by mouth., Disp: , Rfl:  .  Multiple Vitamins-Minerals (MULTIVITAMIN WITH MINERALS) tablet, Take 1 tablet by mouth daily., Disp: , Rfl:  .  NON FORMULARY, Pt uses CPAP machine at bedtime, Disp: , Rfl:  .  OXYGEN, Inhale 3 L into the lungs at bedtime., Disp: , Rfl:  .  Potassium 99 MG TABS, Take 1 tablet by mouth every other day., Disp: , Rfl:  .  SYMBICORT 160-4.5 MCG/ACT inhaler, Inhale 2 puffs into the lungs 2 (two) times daily., Disp: , Rfl: 4 .  Vitamin D, Ergocalciferol, (DRISDOL) 50000 units CAPS capsule, Take 1 capsule (50,000 Units total) by mouth every 7 (seven) days., Disp: 4 capsule, Rfl: 11 Continue all other maintenance medications as listed above.  Follow up plan Recheck 4 weeks  Educational handout given for [place] PA-C [place] [address]  [place], [place] [address] [phone]   08/10/2017, 10:39 AM

## 2017-08-12 ENCOUNTER — Ambulatory Visit (INDEPENDENT_AMBULATORY_CARE_PROVIDER_SITE_OTHER): Payer: Medicaid Other | Admitting: Orthopaedic Surgery

## 2017-08-12 ENCOUNTER — Encounter (INDEPENDENT_AMBULATORY_CARE_PROVIDER_SITE_OTHER): Payer: Self-pay | Admitting: Orthopaedic Surgery

## 2017-08-12 ENCOUNTER — Ambulatory Visit (INDEPENDENT_AMBULATORY_CARE_PROVIDER_SITE_OTHER): Payer: Medicaid Other

## 2017-08-12 VITALS — BP 115/68 | HR 89 | Resp 16 | Ht 65.0 in | Wt 352.0 lb

## 2017-08-12 DIAGNOSIS — G8929 Other chronic pain: Secondary | ICD-10-CM

## 2017-08-12 DIAGNOSIS — M25512 Pain in left shoulder: Secondary | ICD-10-CM

## 2017-08-12 MED ORDER — METHYLPREDNISOLONE ACETATE 40 MG/ML IJ SUSP
80.0000 mg | INTRAMUSCULAR | Status: AC | PRN
  Administered 2017-08-12: 80 mg

## 2017-08-12 MED ORDER — BUPIVACAINE HCL 0.5 % IJ SOLN
2.0000 mL | INTRAMUSCULAR | Status: AC | PRN
  Administered 2017-08-12: 2 mL via INTRA_ARTICULAR

## 2017-08-12 MED ORDER — LIDOCAINE HCL 2 % IJ SOLN
2.0000 mL | INTRAMUSCULAR | Status: AC | PRN
  Administered 2017-08-12: 2 mL

## 2017-08-12 NOTE — Progress Notes (Signed)
Office Visit Note   Patient: Marissa [name]           Date of Birth: 10/14/1965           MRN: [id number] Visit Date: 08/12/2017              Requested by: Remus LofflerJones, Angel S, PA-C [address] [place], [name] [id number] PCP: Remus LofflerJones, Angel S, PA-C   Assessment & Plan: Visit Diagnoses:  1. Chronic left shoulder pain     Plan: Impingement syndrome left shoulder. We'll try a subacromial cortisone injection and monitor her response  Follow-Up Instructions: Return if symptoms worsen or fail to improve.   Orders:  Orders Placed This Encounter  Procedures  . Large Joint Inj: L subacromial bursa  . XR Shoulder Left   No orders of the defined types were placed in this encounter.     Procedures: Large Joint Inj: L subacromial bursa on 08/12/2017 2:25 PM Indications: pain and diagnostic evaluation Details: 25 G 1.5 in needle, anterolateral approach  Arthrogram: No  Medications: 2 mL lidocaine 2 %; 2 mL bupivacaine 0.5 %; 80 mg methylPREDNISolone acetate 40 MG/ML Consent was given by the patient. Immediately prior to procedure a time out was called to verify the correct patient, procedure, equipment, support staff and site/side marked as required. Patient was prepped and draped in the usual sterile fashion.       Clinical Data: No additional findings.   Subjective: Chief Complaint  Patient presents with  . Left Shoulder - Pain    Marissa [name] is a 51 y o here for chronic left shoulder pain x years.Denies injury, She previously had a job at a [employer] 20 yrs ago. She has not worked since.  Marissa [name] relates that she's had a problem with both shoulders on an episodic basis over "many years. As had cortisone injection in her right shoulder with good result. On this occasion she's been having progressive pain in her left shoulder without injury or trauma. She's having difficulty raising her arm overhead and sleeping on that side. She is disabled on the basis of degenerative disc disease  of the lumbar spine. No numbness or tingling. No skin changes  HPI  Review of Systems  Constitutional: Positive for fatigue. Negative for chills and fever.  Eyes: Negative for itching.  Respiratory: Negative for chest tightness and shortness of breath.   Cardiovascular: Negative for chest pain, palpitations and leg swelling.  Gastrointestinal: Negative for blood in stool, constipation and diarrhea.  Endocrine: Negative for polyuria.  Genitourinary: Negative for dysuria.  Musculoskeletal: Positive for back pain. Negative for joint swelling, neck pain and neck stiffness.  Allergic/Immunologic: Negative for immunocompromised state.  Neurological: Negative for dizziness and numbness.  Hematological: Does not bruise/bleed easily.  Psychiatric/Behavioral: Positive for sleep disturbance. The patient is not nervous/anxious.      Objective: Vital Signs: BP 115/68   Pulse 89   Resp 16   Ht 5\' 5"  (1.651 m)   Wt (!) 352 lb (159.7 kg)   BMI 58.58 kg/m   Physical Exam  Ortho Exam awake alert and oriented 3. Comfortable sitting. Difficulty raising arm passively above 90 of abduction and flexion based on pain. Positive impingement. Good strength. Large arm. Multiple tattoos. No pain at the acromioclavicular joint but did have some pain in the anterior and lateral subacromial region. No crepitation.  Specialty Comments:  No specialty comments available.  Imaging: Xr Shoulder Left  Result Date: 08/12/2017 Films of the left shoulder  obtained in several projections. The humeral head appears to be centered the glenoid. No ectopic calcification. Animal spurring at the inferior aspect of the humeral head but no obvious other degenerative changes. Bulky distal clavicle with some degenerative changes at the acromioclavicular joint and even cyst formation on the clavicle side. No evidence of fracture    PMFS History: Patient Active Problem List   Diagnosis Date Noted  . Right shoulder pain  08/07/2017  . Joint pain 07/08/2017  . Myalgia 07/08/2017  . Pulmonary emphysema ([facility]) 07/08/2017  . Chronic congestive heart failure ([facility]) 07/08/2017  . Encounter for smoking cessation counseling 07/08/2017  . Essential hypertension 07/08/2017  . Migraine without aura, with intractable migraine, so stated, without mention of status migrainosus 06/21/2013  . Neck pain 06/21/2013  . Morbid obesity ([facility]) 06/21/2013   Past Medical History:  Diagnosis Date  . Arthritis   . Asthma   . Atelectasis pulmonary   . Back pain   . CHF (congestive heart failure) ([facility])    11/2016  . Chronic back pain   . COPD (chronic obstructive pulmonary disease) ([facility])   . Depression   . Depression with anxiety   . Dyspnea   . Emphysema lung ([facility])   . Emphysema lung ([facility])   . Fibromyalgia   . GERD (gastroesophageal reflux disease)   . Headache(784.0)    HX MIGRAINES  . Hypercholesterolemia   . Hypertension   . Insomnia   . Morbid obesity ([facility])   . Pneumonia    MARCH   2018  . Requires supplemental oxygen    3 LITERS AT HS  . Sleep apnea    BIPAP +  O2    Family History  Problem Relation Age of Onset  . Congestive Heart Failure Mother   . Hypertension Mother     Past Surgical History:  Procedure Laterality Date  . CESAREAN SECTION    . CHOLECYSTECTOMY    . FOOT SURGERY    . GALLBLADDER SURGERY    . TONSILLECTOMY    . TOOTH EXTRACTION N/A 01/30/2017   Procedure: EXTRACTION TEETH 15, 17, 31;  Surgeon: Ocie DoyneJensen, Scott, DDS;  Location: [facility];  Service: Oral Surgery;  Laterality: N/A;   Social History   Occupational History    Employer: OTHER    Comment: n/a  Tobacco Use  . Smoking status: Current Every Day Smoker    Packs/day: 0.25    Years: 35.00    Pack years: 8.75    Types: Cigarettes  . Smokeless tobacco: Never Used  . Tobacco comment: 3 Cigarettes daily-01/27/17  Substance and Sexual Activity  . Alcohol use: No  . Drug use: No  . Sexual activity: Yes

## 2017-08-21 ENCOUNTER — Telehealth: Payer: Self-pay | Admitting: Physician Assistant

## 2017-08-21 MED ORDER — CYCLOBENZAPRINE HCL 10 MG PO TABS: 10.0000 mg | ORAL_TABLET | Freq: Three times a day (TID) | ORAL | 0 refills | Status: DC | PRN

## 2017-08-21 NOTE — Telephone Encounter (Signed)
Patient aware.

## 2017-08-21 NOTE — Telephone Encounter (Signed)
What symptoms do you have? Leg cramps  How long have you been sick? For about 8-9 months   Have you been seen for this problem? Yes, by Lawanna KobusAngel two months ago and was told to take magnesium vitamins and is taking potassium too, she can hardly do anything she will get cramps in feet, legs, arms, and face too splinter like cramps that runs down side of legs and after cramps leave her legs are sore like she has been beat   If your provider decides to give you a prescription, which pharmacy would you like for it to be sent to[facility]   Patient informed that this information will be sent to the clinical staff for review and that they should receive a follow up call.

## 2017-09-02 ENCOUNTER — Encounter: Payer: Self-pay | Admitting: Physician Assistant

## 2017-09-02 ENCOUNTER — Ambulatory Visit: Payer: Medicaid Other | Admitting: Physician Assistant

## 2017-09-02 VITALS — BP 135/79 | HR 101 | Ht 65.0 in | Wt 349.8 lb

## 2017-09-02 DIAGNOSIS — T50905A Adverse effect of unspecified drugs, medicaments and biological substances, initial encounter: Secondary | ICD-10-CM

## 2017-09-02 DIAGNOSIS — R5383 Other fatigue: Secondary | ICD-10-CM

## 2017-09-02 DIAGNOSIS — G8929 Other chronic pain: Secondary | ICD-10-CM

## 2017-09-02 DIAGNOSIS — R51 Headache: Secondary | ICD-10-CM

## 2017-09-02 DIAGNOSIS — R252 Cramp and spasm: Secondary | ICD-10-CM | POA: Diagnosis not present

## 2017-09-02 DIAGNOSIS — R739 Hyperglycemia, unspecified: Secondary | ICD-10-CM | POA: Diagnosis not present

## 2017-09-02 DIAGNOSIS — M25511 Pain in right shoulder: Secondary | ICD-10-CM

## 2017-09-02 LAB — BAYER DCA HB A1C WAIVED: HB A1C (BAYER DCA - WAIVED): 5.8 % (ref <7.0)

## 2017-09-02 MED ORDER — HYDROCODONE-ACETAMINOPHEN 10-325 MG PO TABS: 1.0000 | ORAL_TABLET | ORAL | 0 refills | Status: DC | PRN

## 2017-09-02 MED ORDER — TIZANIDINE HCL 4 MG PO TABS: 4.0000 mg | ORAL_TABLET | Freq: Three times a day (TID) | ORAL | 1 refills | Status: DC

## 2017-09-02 MED ORDER — ROPINIROLE HCL 1 MG PO TABS: 1.0000 mg | ORAL_TABLET | Freq: Every day | ORAL | 1 refills | Status: DC

## 2017-09-02 MED ORDER — PREDNISONE 10 MG (21) PO TBPK: ORAL_TABLET | ORAL | 0 refills | Status: DC

## 2017-09-02 NOTE — Patient Instructions (Signed)
In a few days you may receive a survey in the mail or online from [employer] regarding your visit with us today. Please take a moment to fill this out. Your feedback is very important to our whole office. It can help us better understand your needs as well as improve your experience and satisfaction. Thank you for taking your time to complete it. We care about you.  Therese Rocco, PA-C  

## 2017-09-03 LAB — CBC WITH DIFFERENTIAL/PLATELET
BASOS ABS: 0.1 10*3/uL (ref 0.0–0.2)
Basos: 1 %
EOS (ABSOLUTE): 0.2 10*3/uL (ref 0.0–0.4)
Eos: 2 %
HEMOGLOBIN: 15.9 g/dL (ref 11.1–15.9)
Hematocrit: 47.5 % — ABNORMAL HIGH (ref 34.0–46.6)
IMMATURE GRANS (ABS): 0 10*3/uL (ref 0.0–0.1)
Immature Granulocytes: 0 %
Lymphocytes Absolute: 3.4 10*3/uL — ABNORMAL HIGH (ref 0.7–3.1)
Lymphs: 28 %
MCH: 33.1 pg — AB (ref 26.6–33.0)
MCHC: 33.5 g/dL (ref 31.5–35.7)
MCV: 99 fL — ABNORMAL HIGH (ref 79–97)
MONOCYTES: 7 %
Monocytes Absolute: 0.9 10*3/uL (ref 0.1–0.9)
NEUTROS ABS: 7.5 10*3/uL — AB (ref 1.4–7.0)
Neutrophils: 62 %
Platelets: 242 10*3/uL (ref 150–379)
RBC: 4.81 x10E6/uL (ref 3.77–5.28)
RDW: 13 % (ref 12.3–15.4)
WBC: 12.1 10*3/uL — ABNORMAL HIGH (ref 3.4–10.8)

## 2017-09-03 LAB — CMP14+EGFR
ALBUMIN: 4.1 g/dL (ref 3.5–5.5)
ALT: 21 IU/L (ref 0–32)
AST: 24 IU/L (ref 0–40)
Albumin/Globulin Ratio: 1.2 (ref 1.2–2.2)
Alkaline Phosphatase: 102 IU/L (ref 39–117)
BUN / CREAT RATIO: 12 (ref 9–23)
BUN: 9 mg/dL (ref 6–24)
Bilirubin Total: 0.5 mg/dL (ref 0.0–1.2)
CO2: 26 mmol/L (ref 20–29)
Calcium: 9.6 mg/dL (ref 8.7–10.2)
Chloride: 95 mmol/L — ABNORMAL LOW (ref 96–106)
Creatinine, Ser: 0.73 mg/dL (ref 0.57–1.00)
GFR calc non Af Amer: 96 mL/min/{1.73_m2} (ref >59)
GFR, EST AFRICAN AMERICAN: 111 mL/min/{1.73_m2} (ref >59)
GLOBULIN, TOTAL: 3.3 g/dL (ref 1.5–4.5)
GLUCOSE: 133 mg/dL — AB (ref 65–99)
Potassium: 4.6 mmol/L (ref 3.5–5.2)
Sodium: 137 mmol/L (ref 134–144)
TOTAL PROTEIN: 7.4 g/dL (ref 6.0–8.5)

## 2017-09-04 ENCOUNTER — Encounter: Payer: Self-pay | Admitting: Neurology

## 2017-09-04 NOTE — Progress Notes (Signed)
BP 135/79   Pulse (!) 101   Ht 5' 5" (1.651 m)   Wt (!) 349 lb 12.8 oz (158.7 kg)   SpO2 100%   BMI 58.21 kg/m    Subjective:    Patient ID: Marissa [name], female    DOB: Mar 01, 1966, 52 y.o.   MRN: [id number]  HPI: Marissa [name] is a 52 y.o. female presenting on 09/02/2017 for leg cramps; Extremity Weakness; Fatigue; and Shortness of Breath  Patient comes in with a multitude of complaints.  She reports that she is extremely fatigued.  All this is been going on for the past week.  She has severe cramps and spasms all over her body.  She has a lot of cramping through the night.  She has never been diagnosed with restless leg syndrome.  However she has also felt the movement and need to stand up in the evening.  She feels very weak all over.  She is due lab work.  She never had a rheumatological evaluation.  She also has been some years that she has seen her neurologist.  She has had migraines in the past.  Relevant past medical, surgical, family and social history reviewed and updated as indicated. Allergies and medications reviewed and updated.  Past Medical History:  Diagnosis Date  . Arthritis   . Asthma   . Atelectasis pulmonary   . Back pain   . CHF (congestive heart failure) ([place])    11/2016  . Chronic back pain   . COPD (chronic obstructive pulmonary disease) ([place])   . Depression   . Depression with anxiety   . Dyspnea   . Emphysema lung ([place])   . Emphysema lung ([place])   . Fibromyalgia   . GERD (gastroesophageal reflux disease)   . Headache(784.0)    HX MIGRAINES  . Hypercholesterolemia   . Hypertension   . Insomnia   . Morbid obesity ([place])   . Pneumonia    MARCH   2018  . Requires supplemental oxygen    3 LITERS AT HS  . Sleep apnea    BIPAP +  O2    Past Surgical History:  Procedure Laterality Date  . CESAREAN SECTION    . CHOLECYSTECTOMY    . FOOT SURGERY    . GALLBLADDER SURGERY    . TONSILLECTOMY    . TOOTH EXTRACTION N/A 01/30/2017   Procedure: EXTRACTION  TEETH 15, 17, 31;  Surgeon: Diona Browner, DDS;  Location: [facility];  Service: Oral Surgery;  Laterality: N/A;    Review of Systems  Constitutional: Positive for fatigue. Negative for activity change, diaphoresis and fever.  HENT: Negative.   Eyes: Negative.   Respiratory: Positive for shortness of breath. Negative for cough and wheezing.   Cardiovascular: Negative.  Negative for chest pain, palpitations and leg swelling.  Gastrointestinal: Negative.  Negative for abdominal pain.  Endocrine: Negative.   Genitourinary: Negative.  Negative for dysuria.  Musculoskeletal: Positive for arthralgias, back pain, gait problem and myalgias.  Skin: Negative.   Neurological: Positive for headaches.    Allergies as of 09/02/2017      Reactions   Advera [compleat] Itching   Throat Itching   Relpax [eletriptan] Other (See Comments)   Weakness, couldn't move   Citalopram    Morphine And Related Itching      Medication List        Accurate as of 09/02/17 11:59 PM. Always use your most recent med list.  albuterol (2.5 MG/3ML) 0.083% nebulizer solution Commonly known as:  PROVENTIL Take 2.5 mg by nebulization every 6 (six) hours as needed for wheezing or shortness of breath.   albuterol 108 (90 Base) MCG/ACT inhaler Commonly known as:  PROVENTIL HFA;VENTOLIN HFA Inhale 2 puffs into the lungs every 4 (four) hours as needed for wheezing or shortness of breath.   ALPRAZolam 1 MG tablet Commonly known as:  XANAX Take 1 mg by mouth 3 (three) times daily.   furosemide 20 MG tablet Commonly known as:  LASIX TAKE (1) TABLET BY MOUTH ONCE DAILY AS NEEDED.   gabapentin 300 MG capsule Commonly known as:  NEURONTIN Take 1-3 capsules (300-900 mg total) at bedtime by mouth.   HYDROcodone-acetaminophen 10-325 MG tablet Commonly known as:  NORCO Take 1 tablet by mouth every 4 (four) hours as needed.   ibuprofen 200 MG tablet Commonly known as:  ADVIL,MOTRIN Take 800 mg by mouth every 8  (eight) hours as needed for mild pain or moderate pain.   LATUDA 60 MG Tabs Generic drug:  Lurasidone HCl Take 60 mg daily by mouth.   lisinopril 10 MG tablet Commonly known as:  PRINIVIL,ZESTRIL Take 1 tablet (10 mg total) by mouth 2 (two) times daily.   multivitamin with minerals tablet Take 1 tablet by mouth daily.   NON FORMULARY Pt uses CPAP machine at bedtime   OXYGEN Inhale 3 L into the lungs at bedtime.   Potassium 99 MG Tabs Take 1 tablet by mouth every other day.   predniSONE 10 MG (21) Tbpk tablet Commonly known as:  STERAPRED UNI-PAK 21 TAB As directed x 6 days   rOPINIRole 1 MG tablet Commonly known as:  REQUIP Take 1-2 tablets (1-2 mg total) by mouth at bedtime.   SYMBICORT 160-4.5 MCG/ACT inhaler Generic drug:  budesonide-formoterol Inhale 2 puffs into the lungs 2 (two) times daily.   tiZANidine 4 MG tablet Commonly known as:  ZANAFLEX Take 1 tablet (4 mg total) by mouth 3 (three) times daily.   Vitamin D (Ergocalciferol) 50000 units Caps capsule Commonly known as:  DRISDOL Take 1 capsule (50,000 Units total) by mouth every 7 (seven) days.   ZYRTEC ALLERGY 10 MG Caps Generic drug:  Cetirizine HCl Take 10 mg by mouth daily.          Objective:    BP 135/79   Pulse (!) 101   Ht 5' 5" (1.651 m)   Wt (!) 349 lb 12.8 oz (158.7 kg)   SpO2 100%   BMI 58.21 kg/m   Allergies  Allergen Reactions  . Advera [Compleat] Itching    Throat Itching  . Relpax [Eletriptan] Other (See Comments)    Weakness, couldn't move  . Citalopram   . Morphine And Related Itching    Physical Exam  Constitutional: She is oriented to person, place, and time. She appears well-developed and well-nourished. She appears distressed.  HENT:  Head: Normocephalic and atraumatic.  Right Ear: Tympanic membrane, external ear and ear canal normal.  Left Ear: Tympanic membrane, external ear and ear canal normal.  Nose: Nose normal. No rhinorrhea.  Mouth/Throat: Oropharynx  is clear and moist and mucous membranes are normal. No oropharyngeal exudate or posterior oropharyngeal erythema.  Eyes: Conjunctivae and EOM are normal. Pupils are equal, round, and reactive to light.  Neck: Normal range of motion. Neck supple.  Cardiovascular: Normal rate, regular rhythm, normal heart sounds and intact distal pulses.  Pulmonary/Chest: Effort normal and breath sounds normal.  Abdominal: Soft. Bowel  sounds are normal.  Neurological: She is alert and oriented to person, place, and time. She has normal reflexes.  Skin: Skin is warm and dry. No rash noted.  Psychiatric: She has a normal mood and affect. Her behavior is normal. Judgment and thought content normal.  Nursing note and vitals reviewed.   Results for orders placed or performed in visit on 09/02/17  CBC with Differential/Platelet  Result Value Ref Range   WBC 12.1 (H) 3.4 - 10.8 x10E3/uL   RBC 4.81 3.77 - 5.28 x10E6/uL   Hemoglobin 15.9 11.1 - 15.9 g/dL   Hematocrit 47.5 (H) 34.0 - 46.6 %   MCV 99 (H) 79 - 97 fL   MCH 33.1 (H) 26.6 - 33.0 pg   MCHC 33.5 31.5 - 35.7 g/dL   RDW 13.0 12.3 - 15.4 %   Platelets 242 150 - 379 x10E3/uL   Neutrophils 62 Not Estab. %   Lymphs 28 Not Estab. %   Monocytes 7 Not Estab. %   Eos 2 Not Estab. %   Basos 1 Not Estab. %   Neutrophils Absolute 7.5 (H) 1.4 - 7.0 x10E3/uL   Lymphocytes Absolute 3.4 (H) 0.7 - 3.1 x10E3/uL   Monocytes Absolute 0.9 0.1 - 0.9 x10E3/uL   EOS (ABSOLUTE) 0.2 0.0 - 0.4 x10E3/uL   Basophils Absolute 0.1 0.0 - 0.2 x10E3/uL   Immature Granulocytes 0 Not Estab. %   Immature Grans (Abs) 0.0 0.0 - 0.1 x10E3/uL  CMP14+EGFR  Result Value Ref Range   Glucose 133 (H) 65 - 99 mg/dL   BUN 9 6 - 24 mg/dL   Creatinine, Ser 0.73 0.57 - 1.00 mg/dL   GFR calc non Af Amer 96 >59 mL/min/1.73   GFR calc Af Amer 111 >59 mL/min/1.73   BUN/Creatinine Ratio 12 9 - 23   Sodium 137 134 - 144 mmol/L   Potassium 4.6 3.5 - 5.2 mmol/L   Chloride 95 (L) 96 - 106 mmol/L    CO2 26 20 - 29 mmol/L   Calcium 9.6 8.7 - 10.2 mg/dL   Total Protein 7.4 6.0 - 8.5 g/dL   Albumin 4.1 3.5 - 5.5 g/dL   Globulin, Total 3.3 1.5 - 4.5 g/dL   Albumin/Globulin Ratio 1.2 1.2 - 2.2   Bilirubin Total 0.5 0.0 - 1.2 mg/dL   Alkaline Phosphatase 102 39 - 117 IU/L   AST 24 0 - 40 IU/L   ALT 21 0 - 32 IU/L  Bayer DCA Hb A1c Waived  Result Value Ref Range   Bayer DCA Hb A1c Waived 5.8 <7.0 %      Assessment & Plan:   1. Fatigue, unspecified type - CBC with Differential/Platelet - CMP14+EGFR - Bayer DCA Hb A1c Waived  2. Cramp and spasm - CBC with Differential/Platelet - CMP14+EGFR - Bayer DCA Hb A1c Waived - rOPINIRole (REQUIP) 1 MG tablet; Take 1-2 tablets (1-2 mg total) by mouth at bedtime.  Dispense: 60 tablet; Refill: 1 - predniSONE (STERAPRED UNI-PAK 21 TAB) 10 MG (21) TBPK tablet; As directed x 6 days  Dispense: 21 tablet; Refill: 0 - tiZANidine (ZANAFLEX) 4 MG tablet; Take 1 tablet (4 mg total) by mouth 3 (three) times daily.  Dispense: 90 tablet; Refill: 1  3. Drug-induced hyperglycemia - Bayer DCA Hb A1c Waived  4. Chronic right shoulder pain - HYDROcodone-acetaminophen (NORCO) 10-325 MG tablet; Take 1 tablet by mouth every 4 (four) hours as needed.  Dispense: 40 tablet; Refill: 0    Current Outpatient Medications:  .  albuterol (PROVENTIL HFA;VENTOLIN HFA) 108 (90 BASE) MCG/ACT inhaler, Inhale 2 puffs into the lungs every 4 (four) hours as needed for wheezing or shortness of breath., Disp: 1 Inhaler, Rfl: 2 .  albuterol (PROVENTIL) (2.5 MG/3ML) 0.083% nebulizer solution, Take 2.5 mg by nebulization every 6 (six) hours as needed for wheezing or shortness of breath., Disp: , Rfl:  .  ALPRAZolam (XANAX) 1 MG tablet, Take 1 mg by mouth 3 (three) times daily. , Disp: , Rfl:  .  Cetirizine HCl (ZYRTEC ALLERGY) 10 MG CAPS, Take 10 mg by mouth daily. , Disp: , Rfl:  .  furosemide (LASIX) 20 MG tablet, TAKE (1) TABLET BY MOUTH ONCE DAILY AS NEEDED., Disp: 30 tablet,  Rfl: 11 .  gabapentin (NEURONTIN) 300 MG capsule, Take 1-3 capsules (300-900 mg total) at bedtime by mouth., Disp: 90 capsule, Rfl: 3 .  HYDROcodone-acetaminophen (NORCO) 10-325 MG tablet, Take 1 tablet by mouth every 4 (four) hours as needed., Disp: 40 tablet, Rfl: 0 .  ibuprofen (ADVIL,MOTRIN) 200 MG tablet, Take 800 mg by mouth every 8 (eight) hours as needed for mild pain or moderate pain., Disp: , Rfl:  .  lisinopril (PRINIVIL,ZESTRIL) 10 MG tablet, Take 1 tablet (10 mg total) by mouth 2 (two) times daily., Disp: 30 tablet, Rfl: 11 .  Lurasidone HCl (LATUDA) 60 MG TABS, Take 60 mg daily by mouth., Disp: , Rfl:  .  Multiple Vitamins-Minerals (MULTIVITAMIN WITH MINERALS) tablet, Take 1 tablet by mouth daily., Disp: , Rfl:  .  NON FORMULARY, Pt uses CPAP machine at bedtime, Disp: , Rfl:  .  OXYGEN, Inhale 3 L into the lungs at bedtime., Disp: , Rfl:  .  Potassium 99 MG TABS, Take 1 tablet by mouth every other day., Disp: , Rfl:  .  predniSONE (STERAPRED UNI-PAK 21 TAB) 10 MG (21) TBPK tablet, As directed x 6 days, Disp: 21 tablet, Rfl: 0 .  rOPINIRole (REQUIP) 1 MG tablet, Take 1-2 tablets (1-2 mg total) by mouth at bedtime., Disp: 60 tablet, Rfl: 1 .  SYMBICORT 160-4.5 MCG/ACT inhaler, Inhale 2 puffs into the lungs 2 (two) times daily., Disp: , Rfl: 4 .  tiZANidine (ZANAFLEX) 4 MG tablet, Take 1 tablet (4 mg total) by mouth 3 (three) times daily., Disp: 90 tablet, Rfl: 1 .  Vitamin D, Ergocalciferol, (DRISDOL) 50000 units CAPS capsule, Take 1 capsule (50,000 Units total) by mouth every 7 (seven) days., Disp: 4 capsule, Rfl: 11 Continue all other maintenance medications as listed above.  Follow up plan: Return in about 2 weeks (around 09/16/2017) for recheck.  Educational handout given for [place] PA-C [facility] [address]  [place], [place] [address] [phone]   09/04/2017, 9:15 AM

## 2017-09-04 NOTE — Addendum Note (Signed)
Addended byDory [place]: RINTELMANN, GINA C on: 09/04/2017 03:21 PM   Modules accepted: Orders

## 2017-09-07 ENCOUNTER — Ambulatory Visit: Payer: Medicaid Other | Admitting: Physician Assistant

## 2017-09-08 ENCOUNTER — Ambulatory Visit: Payer: Medicaid Other | Admitting: Physician Assistant

## 2017-09-15 ENCOUNTER — Encounter: Payer: Self-pay | Admitting: Physician Assistant

## 2017-09-15 ENCOUNTER — Ambulatory Visit (INDEPENDENT_AMBULATORY_CARE_PROVIDER_SITE_OTHER): Payer: Medicaid Other | Admitting: Physician Assistant

## 2017-09-15 VITALS — BP 144/94 | HR 108 | Temp 97.9°F | Ht 65.0 in | Wt 351.0 lb

## 2017-09-15 DIAGNOSIS — M79604 Pain in right leg: Secondary | ICD-10-CM

## 2017-09-15 DIAGNOSIS — R252 Cramp and spasm: Secondary | ICD-10-CM

## 2017-09-15 DIAGNOSIS — R238 Other skin changes: Secondary | ICD-10-CM

## 2017-09-15 DIAGNOSIS — M79605 Pain in left leg: Secondary | ICD-10-CM

## 2017-09-15 DIAGNOSIS — M791 Myalgia, unspecified site: Secondary | ICD-10-CM | POA: Diagnosis not present

## 2017-09-15 DIAGNOSIS — M25511 Pain in right shoulder: Secondary | ICD-10-CM | POA: Diagnosis not present

## 2017-09-15 DIAGNOSIS — G8929 Other chronic pain: Secondary | ICD-10-CM

## 2017-09-15 DIAGNOSIS — M25552 Pain in left hip: Secondary | ICD-10-CM

## 2017-09-15 DIAGNOSIS — R233 Spontaneous ecchymoses: Secondary | ICD-10-CM

## 2017-09-15 MED ORDER — CYANOCOBALAMIN 1000 MCG/ML IJ SOLN: 1000.0000 ug | Freq: Once | INTRAMUSCULAR | Status: DC

## 2017-09-15 MED ORDER — HYDROCODONE-ACETAMINOPHEN 10-325 MG PO TABS: 1.0000 | ORAL_TABLET | ORAL | 0 refills | Status: DC | PRN

## 2017-09-15 NOTE — Patient Instructions (Signed)
Vit B complex, take 3 times a day Have at least 30 mg of B6  Vitamin E 800 IU daily

## 2017-09-16 NOTE — Progress Notes (Signed)
BP (!) 144/94   Pulse (!) 108   Temp 97.9 F (36.6 C) (Oral)   Ht '5\' 5"'  (1.651 m)   Wt (!) 351 lb (159.2 kg)   BMI 58.41 kg/m    Subjective:    Patient ID: Marissa [name], female    DOB: April 19, 1966, 52 y.o.   MRN: [id number]  HPI: [name] is a 52 y.o. female presenting on 09/15/2017 for leg cramps  Patient is experiencing cramps throughout her body.  Involves her legs but also involves muscles across her abdomen and in her arms.  She has not been to a neurologist in some concern about some type of neuromuscular disorder or possibly something autoimmune.  We have tried several medications that have not reduced her breathing at all.  We have increased her Requip unchanged.  We have discussed many other possibilities for this.  She had tried magnesium and iron over-the-counter without any change.  We have not tried any vitamin B or vitamin D.  We will draw labs today to look for any causes.  Relevant past medical, surgical, family and social history reviewed and updated as indicated. Allergies and medications reviewed and updated.  Past Medical History:  Diagnosis Date  . Arthritis   . Asthma   . Atelectasis pulmonary   . Back pain   . CHF (congestive heart failure) ([place])    11/2016  . Chronic back pain   . COPD (chronic obstructive pulmonary disease) ([place])   . Depression   . Depression with anxiety   . Dyspnea   . Emphysema lung ([place])   . Emphysema lung ([place])   . Fibromyalgia   . GERD (gastroesophageal reflux disease)   . Headache(784.0)    HX MIGRAINES  . Hypercholesterolemia   . Hypertension   . Insomnia   . Morbid obesity ([place])   . Pneumonia    MARCH   2018  . Requires supplemental oxygen    3 LITERS AT HS  . Sleep apnea    BIPAP +  O2    Past Surgical History:  Procedure Laterality Date  . CESAREAN SECTION    . CHOLECYSTECTOMY    . FOOT SURGERY    . GALLBLADDER SURGERY    . TONSILLECTOMY    . TOOTH EXTRACTION N/A 01/30/2017   Procedure: EXTRACTION TEETH  15, 17, 31;  Surgeon: Diona Browner, DDS;  Location: [facility];  Service: Oral Surgery;  Laterality: N/A;    Review of Systems  Constitutional: Positive for activity change and fatigue. Negative for chills and fever.  HENT: Positive for congestion.   Eyes: Negative.   Respiratory: Negative.  Negative for cough, shortness of breath and wheezing.   Cardiovascular: Negative.  Negative for chest pain and palpitations.  Gastrointestinal: Positive for abdominal pain.  Endocrine: Negative.   Genitourinary: Negative.  Negative for dysuria.  Musculoskeletal: Positive for arthralgias, back pain and myalgias.  Skin: Negative.   Neurological: Negative.   Psychiatric/Behavioral: The patient is nervous/anxious.     Allergies as of 09/15/2017      Reactions   Advera [compleat] Itching   Throat Itching   Relpax [eletriptan] Other (See Comments)   Weakness, couldn't move   Advair Diskus [fluticasone-salmeterol]    Ativan [lorazepam]    Citalopram    Morphine And Related Itching      Medication List        Accurate as of 09/15/17 11:59 PM. Always use your most recent med list.  albuterol (2.5 MG/3ML) 0.083% nebulizer solution Commonly known as:  PROVENTIL Take 2.5 mg by nebulization every 6 (six) hours as needed for wheezing or shortness of breath.   albuterol 108 (90 Base) MCG/ACT inhaler Commonly known as:  PROVENTIL HFA;VENTOLIN HFA Inhale 2 puffs into the lungs every 4 (four) hours as needed for wheezing or shortness of breath.   ALPRAZolam 1 MG tablet Commonly known as:  XANAX Take 1 mg by mouth 3 (three) times daily.   furosemide 20 MG tablet Commonly known as:  LASIX TAKE (1) TABLET BY MOUTH ONCE DAILY AS NEEDED.   gabapentin 300 MG capsule Commonly known as:  NEURONTIN Take 1-3 capsules (300-900 mg total) at bedtime by mouth.   HYDROcodone-acetaminophen 10-325 MG tablet Commonly known as:  NORCO Take 1 tablet by mouth every 4 (four) hours as needed.   ibuprofen  200 MG tablet Commonly known as:  ADVIL,MOTRIN Take 800 mg by mouth every 8 (eight) hours as needed for mild pain or moderate pain.   LATUDA 60 MG Tabs Generic drug:  Lurasidone HCl Take 60 mg daily by mouth.   lisinopril 10 MG tablet Commonly known as:  PRINIVIL,ZESTRIL Take 1 tablet (10 mg total) by mouth 2 (two) times daily.   multivitamin with minerals tablet Take 1 tablet by mouth daily.   NON FORMULARY Pt uses CPAP machine at bedtime   OXYGEN Inhale 3 L into the lungs at bedtime.   Potassium 99 MG Tabs Take 1 tablet by mouth every other day.   rOPINIRole 1 MG tablet Commonly known as:  REQUIP Take 1-2 tablets (1-2 mg total) by mouth at bedtime.   SYMBICORT 160-4.5 MCG/ACT inhaler Generic drug:  budesonide-formoterol Inhale 2 puffs into the lungs 2 (two) times daily.   tiZANidine 4 MG tablet Commonly known as:  ZANAFLEX Take 1 tablet (4 mg total) by mouth 3 (three) times daily.   Vitamin D (Ergocalciferol) 50000 units Caps capsule Commonly known as:  DRISDOL Take 1 capsule (50,000 Units total) by mouth every 7 (seven) days.   ZYRTEC ALLERGY 10 MG Caps Generic drug:  Cetirizine HCl Take 10 mg by mouth daily.          Objective:    BP (!) 144/94   Pulse (!) 108   Temp 97.9 F (36.6 C) (Oral)   Ht '5\' 5"'  (1.651 m)   Wt (!) 351 lb (159.2 kg)   BMI 58.41 kg/m   Allergies  Allergen Reactions  . Advera [Compleat] Itching    Throat Itching  . Relpax [Eletriptan] Other (See Comments)    Weakness, couldn't move  . Advair Diskus [Fluticasone-Salmeterol]   . Ativan [Lorazepam]   . Citalopram   . Morphine And Related Itching    Physical Exam  Constitutional: She is oriented to person, place, and time. She appears well-developed and well-nourished. She appears distressed.  HENT:  Head: Normocephalic and atraumatic.  Right Ear: Tympanic membrane, external ear and ear canal normal.  Left Ear: Tympanic membrane, external ear and ear canal normal.  Nose:  Nose normal. No rhinorrhea.  Mouth/Throat: Oropharynx is clear and moist and mucous membranes are normal. No oropharyngeal exudate or posterior oropharyngeal erythema.  Eyes: Conjunctivae and EOM are normal. Pupils are equal, round, and reactive to light.  Neck: Normal range of motion. Neck supple.  Cardiovascular: Normal rate, regular rhythm, normal heart sounds and intact distal pulses.  Pulmonary/Chest: Effort normal and breath sounds normal.  Abdominal: Soft. Bowel sounds are normal.  Neurological: She is  alert and oriented to person, place, and time. She has normal reflexes.  Skin: Skin is warm and dry. No rash noted.  Psychiatric: She has a normal mood and affect. Her behavior is normal. Judgment and thought content normal.    Results for orders placed or performed in visit on 09/15/17  Magnesium  Result Value Ref Range   Magnesium 1.8 1.6 - 2.3 mg/dL  CMP14+EGFR  Result Value Ref Range   Glucose 157 (H) 65 - 99 mg/dL   BUN 12 6 - 24 mg/dL   Creatinine, Ser 0.68 0.57 - 1.00 mg/dL   GFR calc non Af Amer 102 >59 mL/min/1.73   GFR calc Af Amer 118 >59 mL/min/1.73   BUN/Creatinine Ratio 18 9 - 23   Sodium 137 134 - 144 mmol/L   Potassium 4.3 3.5 - 5.2 mmol/L   Chloride 98 96 - 106 mmol/L   CO2 22 20 - 29 mmol/L   Calcium 9.2 8.7 - 10.2 mg/dL   Total Protein 7.0 6.0 - 8.5 g/dL   Albumin 4.2 3.5 - 5.5 g/dL   Globulin, Total 2.8 1.5 - 4.5 g/dL   Albumin/Globulin Ratio 1.5 1.2 - 2.2   Bilirubin Total 0.4 0.0 - 1.2 mg/dL   Alkaline Phosphatase 89 39 - 117 IU/L   AST 17 0 - 40 IU/L   ALT 19 0 - 32 IU/L  Rocky mtn spotted fvr abs pnl(IgG+IgM)  Result Value Ref Range   RMSF IgG WILL FOLLOW    RMSF IgM WILL FOLLOW   Thyroid Panel With TSH  Result Value Ref Range   TSH 3.200 0.450 - 4.500 uIU/mL   T4, Total 6.8 4.5 - 12.0 ug/dL   T3 Uptake Ratio 24 24 - 39 %   Free Thyroxine Index 1.6 1.2 - 4.9  Vitamin B12  Result Value Ref Range   Vitamin B-12 274 232 - 1,245 pg/mL    VITAMIN D 25 Hydroxy (Vit-D Deficiency, Fractures)  Result Value Ref Range   Vit D, 25-Hydroxy 20.7 (L) 30.0 - 100.0 ng/mL  Folate  Result Value Ref Range   Folate 8.7 >3.0 ng/mL  Arthritis Panel  Result Value Ref Range   Uric Acid 5.4 2.5 - 7.1 mg/dL   Rhuematoid fact SerPl-aCnc <10.0 0.0 - 13.9 IU/mL   WBC 13.0 (H) 3.4 - 10.8 x10E3/uL   RBC 4.66 3.77 - 5.28 x10E6/uL   Hemoglobin 15.6 11.1 - 15.9 g/dL   Hematocrit 46.0 34.0 - 46.6 %   MCV 99 (H) 79 - 97 fL   MCH 33.5 (H) 26.6 - 33.0 pg   MCHC 33.9 31.5 - 35.7 g/dL   RDW 13.0 12.3 - 15.4 %   Platelets 244 150 - 379 x10E3/uL   Neutrophils 67 Not Estab. %   Lymphs 25 Not Estab. %   Monocytes 7 Not Estab. %   Eos 1 Not Estab. %   Basos 0 Not Estab. %   Neutrophils Absolute 8.6 (H) 1.4 - 7.0 x10E3/uL   Lymphocytes Absolute 3.2 (H) 0.7 - 3.1 x10E3/uL   Monocytes Absolute 0.9 0.1 - 0.9 x10E3/uL   EOS (ABSOLUTE) 0.1 0.0 - 0.4 x10E3/uL   Basophils Absolute 0.1 0.0 - 0.2 x10E3/uL   Immature Granulocytes 0 Not Estab. %   Immature Grans (Abs) 0.0 0.0 - 0.1 x10E3/uL   Sed Rate 38 0 - 40 mm/hr  Protime-INR  Result Value Ref Range   INR 1.0 0.8 - 1.2   Prothrombin Time 10.2 9.1 - 12.0 sec  Assessment & Plan:   1. Easy bruising - CMP14+EGFR - CBC with Differential/Platelet - Sedimentation rate - Protime-INR  2. Cramps, extremity - Magnesium - CMP14+EGFR - Rocky mtn spotted fvr abs pnl(IgG+IgM) - Thyroid Panel With TSH - Vitamin B12 - VITAMIN D 25 Hydroxy (Vit-D Deficiency, Fractures) - CBC with Differential/Platelet - Folate - Sedimentation rate - Ambulatory referral to Neurology  3. Hand cramps - Ambulatory referral to Neurology  4. Pain in both lower extremities - Vitamin B12 - VITAMIN D 25 Hydroxy (Vit-D Deficiency, Fractures) - CBC with Differential/Platelet - Arthritis Panel - Ambulatory referral to Neurology  5. Myalgia - Magnesium - CMP14+EGFR - Rocky mtn spotted fvr abs pnl(IgG+IgM) - Thyroid  Panel With TSH - Vitamin B12 - VITAMIN D 25 Hydroxy (Vit-D Deficiency, Fractures) - CBC with Differential/Platelet - Arthritis Panel - Sedimentation rate - Ambulatory referral to Neurology  6. Pain of left hip joint - VITAMIN D 25 Hydroxy (Vit-D Deficiency, Fractures) - Arthritis Panel - Ambulatory referral to Neurology  7. Chronic right shoulder pain - HYDROcodone-acetaminophen (NORCO) 10-325 MG tablet; Take 1 tablet by mouth every 4 (four) hours as needed.  Dispense: 60 tablet; Refill: 0    Current Outpatient Medications:  .  albuterol (PROVENTIL HFA;VENTOLIN HFA) 108 (90 BASE) MCG/ACT inhaler, Inhale 2 puffs into the lungs every 4 (four) hours as needed for wheezing or shortness of breath., Disp: 1 Inhaler, Rfl: 2 .  albuterol (PROVENTIL) (2.5 MG/3ML) 0.083% nebulizer solution, Take 2.5 mg by nebulization every 6 (six) hours as needed for wheezing or shortness of breath., Disp: , Rfl:  .  ALPRAZolam (XANAX) 1 MG tablet, Take 1 mg by mouth 3 (three) times daily. , Disp: , Rfl:  .  Cetirizine HCl (ZYRTEC ALLERGY) 10 MG CAPS, Take 10 mg by mouth daily. , Disp: , Rfl:  .  furosemide (LASIX) 20 MG tablet, TAKE (1) TABLET BY MOUTH ONCE DAILY AS NEEDED., Disp: 30 tablet, Rfl: 11 .  gabapentin (NEURONTIN) 300 MG capsule, Take 1-3 capsules (300-900 mg total) at bedtime by mouth., Disp: 90 capsule, Rfl: 3 .  HYDROcodone-acetaminophen (NORCO) 10-325 MG tablet, Take 1 tablet by mouth every 4 (four) hours as needed., Disp: 60 tablet, Rfl: 0 .  ibuprofen (ADVIL,MOTRIN) 200 MG tablet, Take 800 mg by mouth every 8 (eight) hours as needed for mild pain or moderate pain., Disp: , Rfl:  .  lisinopril (PRINIVIL,ZESTRIL) 10 MG tablet, Take 1 tablet (10 mg total) by mouth 2 (two) times daily., Disp: 30 tablet, Rfl: 11 .  Lurasidone HCl (LATUDA) 60 MG TABS, Take 60 mg daily by mouth., Disp: , Rfl:  .  Multiple Vitamins-Minerals (MULTIVITAMIN WITH MINERALS) tablet, Take 1 tablet by mouth daily., Disp: , Rfl:   .  NON FORMULARY, Pt uses CPAP machine at bedtime, Disp: , Rfl:  .  OXYGEN, Inhale 3 L into the lungs at bedtime., Disp: , Rfl:  .  Potassium 99 MG TABS, Take 1 tablet by mouth every other day., Disp: , Rfl:  .  rOPINIRole (REQUIP) 1 MG tablet, Take 1-2 tablets (1-2 mg total) by mouth at bedtime., Disp: 60 tablet, Rfl: 1 .  SYMBICORT 160-4.5 MCG/ACT inhaler, Inhale 2 puffs into the lungs 2 (two) times daily., Disp: , Rfl: 4 .  tiZANidine (ZANAFLEX) 4 MG tablet, Take 1 tablet (4 mg total) by mouth 3 (three) times daily., Disp: 90 tablet, Rfl: 1 .  Vitamin D, Ergocalciferol, (DRISDOL) 50000 units CAPS capsule, Take 1 capsule (50,000 Units total) by mouth every  7 (seven) days., Disp: 4 capsule, Rfl: 11 Continue all other maintenance medications as listed above.  Follow up plan: Return in about 4 weeks (around 10/13/2017).  Educational handout given for [place] PA-C [place] [address]  [place], [place] [id number] [phone]   09/16/2017, 1:37 PM

## 2017-09-18 ENCOUNTER — Telehealth: Payer: Self-pay

## 2017-09-18 ENCOUNTER — Ambulatory Visit: Payer: Medicaid Other | Admitting: Physician Assistant

## 2017-09-18 DIAGNOSIS — F339 Major depressive disorder, recurrent, unspecified: Secondary | ICD-10-CM

## 2017-09-18 DIAGNOSIS — F4310 Post-traumatic stress disorder, unspecified: Secondary | ICD-10-CM

## 2017-09-18 DIAGNOSIS — F319 Bipolar disorder, unspecified: Secondary | ICD-10-CM

## 2017-09-18 LAB — ARTHRITIS PANEL
BASOS ABS: 0.1 10*3/uL (ref 0.0–0.2)
BASOS: 0 %
EOS (ABSOLUTE): 0.1 10*3/uL (ref 0.0–0.4)
EOS: 1 %
HEMATOCRIT: 46 % (ref 34.0–46.6)
HEMOGLOBIN: 15.6 g/dL (ref 11.1–15.9)
Immature Grans (Abs): 0 10*3/uL (ref 0.0–0.1)
Immature Granulocytes: 0 %
LYMPHS ABS: 3.2 10*3/uL — AB (ref 0.7–3.1)
Lymphs: 25 %
MCH: 33.5 pg — ABNORMAL HIGH (ref 26.6–33.0)
MCHC: 33.9 g/dL (ref 31.5–35.7)
MCV: 99 fL — AB (ref 79–97)
MONOS ABS: 0.9 10*3/uL (ref 0.1–0.9)
Monocytes: 7 %
NEUTROS ABS: 8.6 10*3/uL — AB (ref 1.4–7.0)
Neutrophils: 67 %
Platelets: 244 10*3/uL (ref 150–379)
RBC: 4.66 x10E6/uL (ref 3.77–5.28)
RDW: 13 % (ref 12.3–15.4)
Rhuematoid fact SerPl-aCnc: <10 IU/mL (ref 0.0–13.9)
Sed Rate: 38 mm/hr (ref 0–40)
Uric Acid: 5.4 mg/dL (ref 2.5–7.1)
WBC: 13 10*3/uL — AB (ref 3.4–10.8)

## 2017-09-18 LAB — CMP14+EGFR
A/G RATIO: 1.5 (ref 1.2–2.2)
ALT: 19 IU/L (ref 0–32)
AST: 17 IU/L (ref 0–40)
Albumin: 4.2 g/dL (ref 3.5–5.5)
Alkaline Phosphatase: 89 IU/L (ref 39–117)
BUN/Creatinine Ratio: 18 (ref 9–23)
BUN: 12 mg/dL (ref 6–24)
Bilirubin Total: 0.4 mg/dL (ref 0.0–1.2)
CO2: 22 mmol/L (ref 20–29)
CREATININE: 0.68 mg/dL (ref 0.57–1.00)
Calcium: 9.2 mg/dL (ref 8.7–10.2)
Chloride: 98 mmol/L (ref 96–106)
GFR, EST AFRICAN AMERICAN: 118 mL/min/{1.73_m2} (ref >59)
GFR, EST NON AFRICAN AMERICAN: 102 mL/min/{1.73_m2} (ref >59)
GLOBULIN, TOTAL: 2.8 g/dL (ref 1.5–4.5)
Glucose: 157 mg/dL — ABNORMAL HIGH (ref 65–99)
POTASSIUM: 4.3 mmol/L (ref 3.5–5.2)
SODIUM: 137 mmol/L (ref 134–144)
TOTAL PROTEIN: 7 g/dL (ref 6.0–8.5)

## 2017-09-18 LAB — THYROID PANEL WITH TSH
FREE THYROXINE INDEX: 1.6 (ref 1.2–4.9)
T3 UPTAKE RATIO: 24 % (ref 24–39)
T4, Total: 6.8 ug/dL (ref 4.5–12.0)
TSH: 3.2 u[IU]/mL (ref 0.450–4.500)

## 2017-09-18 LAB — VITAMIN D 25 HYDROXY (VIT D DEFICIENCY, FRACTURES): Vit D, 25-Hydroxy: 20.7 ng/mL — ABNORMAL LOW (ref 30.0–100.0)

## 2017-09-18 LAB — PROTIME-INR
INR: 1 (ref 0.8–1.2)
Prothrombin Time: 10.2 s (ref 9.1–12.0)

## 2017-09-18 LAB — ROCKY MTN SPOTTED FVR ABS PNL(IGG+IGM)
RMSF IgG: NEGATIVE
RMSF IgM: 0.23 index (ref 0.00–0.89)

## 2017-09-18 LAB — VITAMIN B12: VITAMIN B 12: 274 pg/mL (ref 232–1245)

## 2017-09-18 LAB — FOLATE: FOLATE: 8.7 ng/mL (ref >3.0)

## 2017-09-18 LAB — MAGNESIUM: Magnesium: 1.8 mg/dL (ref 1.6–2.3)
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
## 2017-09-21 ENCOUNTER — Telehealth: Payer: Self-pay | Admitting: Physician Assistant

## 2017-09-21 NOTE — Telephone Encounter (Signed)
Pt aware of recommendation °

## 2017-09-28 ENCOUNTER — Telehealth (HOSPITAL_COMMUNITY): Payer: Self-pay

## 2017-09-28 NOTE — Telephone Encounter (Signed)
Per Dr. Vanetta ShawlHisada,   This patient is already enrolled in mental health care [facility]  Therefore to to avoid any conflict in care this patient will be placed as inactive status with [facility].

## 2017-10-05 ENCOUNTER — Other Ambulatory Visit: Payer: Self-pay | Admitting: Physician Assistant

## 2017-10-05 DIAGNOSIS — R252 Cramp and spasm: Secondary | ICD-10-CM

## 2017-10-09 ENCOUNTER — Ambulatory Visit: Payer: Medicaid Other | Admitting: Physician Assistant

## 2017-10-13 ENCOUNTER — Ambulatory Visit (INDEPENDENT_AMBULATORY_CARE_PROVIDER_SITE_OTHER): Payer: Medicaid Other | Admitting: Physician Assistant

## 2017-10-13 VITALS — BP 145/94 | HR 85 | Temp 97.6°F | Ht 65.0 in | Wt 362.2 lb

## 2017-10-13 DIAGNOSIS — M25511 Pain in right shoulder: Secondary | ICD-10-CM

## 2017-10-13 DIAGNOSIS — M791 Myalgia, unspecified site: Secondary | ICD-10-CM | POA: Diagnosis not present

## 2017-10-13 DIAGNOSIS — M2578 Osteophyte, vertebrae: Secondary | ICD-10-CM | POA: Insufficient documentation

## 2017-10-13 DIAGNOSIS — G5603 Carpal tunnel syndrome, bilateral upper limbs: Secondary | ICD-10-CM | POA: Diagnosis not present

## 2017-10-13 DIAGNOSIS — R29898 Other symptoms and signs involving the musculoskeletal system: Secondary | ICD-10-CM | POA: Diagnosis not present

## 2017-10-13 DIAGNOSIS — M62838 Other muscle spasm: Secondary | ICD-10-CM | POA: Diagnosis not present

## 2017-10-13 DIAGNOSIS — M5136 Other intervertebral disc degeneration, lumbar region: Secondary | ICD-10-CM

## 2017-10-13 DIAGNOSIS — M779 Enthesopathy, unspecified: Secondary | ICD-10-CM

## 2017-10-13 DIAGNOSIS — G8929 Other chronic pain: Secondary | ICD-10-CM

## 2017-10-13 MED ORDER — HYDROCODONE-ACETAMINOPHEN 10-325 MG PO TABS
1.0000 | ORAL_TABLET | Freq: Three times a day (TID) | ORAL | 0 refills | Status: DC | PRN
Start: 2017-10-13 — End: 2017-11-04

## 2017-10-13 MED ORDER — HYDROCODONE-ACETAMINOPHEN 10-325 MG PO TABS: 1.0000 | ORAL_TABLET | ORAL | 0 refills | Status: DC | PRN

## 2017-10-13 MED ORDER — METFORMIN HCL 500 MG PO TABS: 500.0000 mg | ORAL_TABLET | Freq: Every day | ORAL | 5 refills | Status: DC

## 2017-10-13 MED ORDER — HYDROCODONE-ACETAMINOPHEN 10-325 MG PO TABS: 1.0000 | ORAL_TABLET | Freq: Four times a day (QID) | ORAL | 0 refills | Status: DC | PRN

## 2017-10-13 NOTE — Patient Instructions (Signed)
In a few days you may receive a survey in the mail or online from [employer] regarding your visit with us today. Please take a moment to fill this out. Your feedback is very important to our whole office. It can help us better understand your needs as well as improve your experience and satisfaction. Thank you for taking your time to complete it. We care about you.  Cassie Shedlock, PA-C  

## 2017-10-14 ENCOUNTER — Ambulatory Visit: Payer: Medicaid Other | Admitting: Physician Assistant

## 2017-10-14 ENCOUNTER — Encounter: Payer: Self-pay | Admitting: Physician Assistant

## 2017-10-14 DIAGNOSIS — G5603 Carpal tunnel syndrome, bilateral upper limbs: Secondary | ICD-10-CM | POA: Insufficient documentation

## 2017-10-14 NOTE — Progress Notes (Signed)
BP (!) 145/94   Pulse 85   Temp 97.6 F (36.4 C) (Oral)   Ht '5\' 5"'  (1.651 m)   Wt (!) 362 lb 3.2 oz (164.3 kg)   BMI 60.27 kg/m    Subjective:    Patient ID: Marissa [name], female    DOB: 02-Dec-1965, 52 y.o.   MRN: [id number]  HPI: Marissa [name] is a 52 y.o. female presenting on 10/13/2017 for No chief complaint on file.  This patient in for recheck on her chronic back and leg pain.  She has no degenerative disc at L3-4 with associated nerve compression.  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  Sensitivity that will go numb when she is in certain positions.  She has never been to hand specialist.  It is been several years since her last MRI.  With the worsening of her overall symptoms despite conservative therapy of anti-inflammatories, pain medicine, rest, back exercises, she should get an MRI of her lumbar spine.  Past Medical History:  Diagnosis Date  . Arthritis   . Asthma   . Atelectasis pulmonary   . Back pain   . CHF (congestive heart failure) ([place])    11/2016  . Chronic back pain   . COPD (chronic obstructive pulmonary disease) ([place])   . Depression   . Depression with anxiety   . Dyspnea   . Emphysema lung ([place])   . Emphysema lung ([place])   . Fibromyalgia   . GERD (gastroesophageal reflux disease)   . Headache(784.0)    HX MIGRAINES  . Hypercholesterolemia   . Hypertension   . Insomnia   . Morbid obesity ([place])   . Pneumonia    MARCH   2018  . Requires supplemental oxygen    3 LITERS AT HS  . Sleep apnea    BIPAP +  O2   Relevant past medical, surgical, family and social history reviewed and updated as indicated. Interim medical history since our last visit reviewed. Allergies and medications reviewed and updated. DATA REVIEWED: CHART IN EPIC  Family History reviewed for pertinent findings.  Review of Systems  Constitutional: Negative for activity change, fatigue and fever.  HENT: Negative.   Eyes: Negative.   Respiratory: Negative.   Negative for cough.   Cardiovascular: Negative.  Negative for chest pain.  Gastrointestinal: Negative.  Negative for abdominal pain.  Endocrine: Negative.   Genitourinary: Negative.  Negative for dysuria.  Musculoskeletal: Positive for arthralgias, back pain, gait problem and myalgias.  Skin: Negative.   Neurological: Positive for tremors, weakness, light-headedness and numbness.    Allergies as of 10/13/2017      Reactions   Advera [compleat] Itching   Throat Itching   Relpax [eletriptan] Other (See Comments)   Weakness, couldn't move   Advair Diskus [fluticasone-salmeterol]    Ativan [lorazepam]    Citalopram    Morphine And Related Itching      Medication List        Accurate as of 10/13/17 11:59 PM. Always use your most recent med list.          albuterol (2.5 MG/3ML) 0.083% nebulizer solution Commonly known as:  PROVENTIL Take 2.5 mg by nebulization every 6 (six) hours as needed for wheezing or shortness of breath.   albuterol 108 (90 Base) MCG/ACT inhaler Commonly known as:  PROVENTIL HFA;VENTOLIN HFA Inhale 2 puffs into the lungs every 4 (four) hours as needed for wheezing or shortness of breath.   ALPRAZolam 1 MG tablet Commonly known  as:  XANAX Take 1 mg by mouth 3 (three) times daily.   furosemide 20 MG tablet Commonly known as:  LASIX TAKE (1) TABLET BY MOUTH ONCE DAILY AS NEEDED.   gabapentin 300 MG capsule Commonly known as:  NEURONTIN Take 1-3 capsules (300-900 mg total) at bedtime by mouth.   HYDROcodone-acetaminophen 10-325 MG tablet Commonly known as:  NORCO Take 1 tablet by mouth every 4 (four) hours as needed.   HYDROcodone-acetaminophen 10-325 MG tablet Commonly known as:  NORCO Take 1 tablet by mouth every 8 (eight) hours as needed.   HYDROcodone-acetaminophen 10-325 MG tablet Commonly known as:  NORCO Take 1 tablet by mouth every 6 (six) hours as needed.   ibuprofen 200 MG tablet Commonly known as:  ADVIL,MOTRIN Take 800 mg by mouth  every 8 (eight) hours as needed for mild pain or moderate pain.   LATUDA 60 MG Tabs Generic drug:  Lurasidone HCl Take 60 mg daily by mouth.   lisinopril 10 MG tablet Commonly known as:  PRINIVIL,ZESTRIL Take 1 tablet (10 mg total) by mouth 2 (two) times daily.   metFORMIN 500 MG tablet Commonly known as:  GLUCOPHAGE Take 1 tablet (500 mg total) by mouth daily with breakfast.   multivitamin with minerals tablet Take 1 tablet by mouth daily.   NON FORMULARY Pt uses CPAP machine at bedtime   OXYGEN Inhale 3 L into the lungs at bedtime.   Potassium 99 MG Tabs Take 1 tablet by mouth every other day.   rOPINIRole 1 MG tablet Commonly known as:  REQUIP Take 1-2 tablets (1-2 mg total) by mouth at bedtime.   SYMBICORT 160-4.5 MCG/ACT inhaler Generic drug:  budesonide-formoterol Inhale 2 puffs into the lungs 2 (two) times daily.   tiZANidine 4 MG tablet Commonly known as:  ZANAFLEX TAKE ONE (1) TABLET THREE (3) TIMES EACH DAY   Vitamin D (Ergocalciferol) 50000 units Caps capsule Commonly known as:  DRISDOL Take 1 capsule (50,000 Units total) by mouth every 7 (seven) days.   ZYRTEC ALLERGY 10 MG Caps Generic drug:  Cetirizine HCl Take 10 mg by mouth daily.          Objective:    BP (!) 145/94   Pulse 85   Temp 97.6 F (36.4 C) (Oral)   Ht '5\' 5"'  (1.651 m)   Wt (!) 362 lb 3.2 oz (164.3 kg)   BMI 60.27 kg/m   Allergies  Allergen Reactions  . Advera [Compleat] Itching    Throat Itching  . Relpax [Eletriptan] Other (See Comments)    Weakness, couldn't move  . Advair Diskus [Fluticasone-Salmeterol]   . Ativan [Lorazepam]   . Citalopram   . Morphine And Related Itching    Wt Readings from Last 3 Encounters:  10/13/17 (!) 362 lb 3.2 oz (164.3 kg)  09/15/17 (!) 351 lb (159.2 kg)  09/02/17 (!) 349 lb 12.8 oz (158.7 kg)    Physical Exam  Constitutional: She is oriented to person, place, and time. She appears well-developed and well-nourished.  HENT:  Head:  Normocephalic and atraumatic.  Right Ear: Tympanic membrane, external ear and ear canal normal.  Left Ear: Tympanic membrane, external ear and ear canal normal.  Nose: Nose normal. No rhinorrhea.  Mouth/Throat: Oropharynx is clear and moist and mucous membranes are normal. No oropharyngeal exudate or posterior oropharyngeal erythema.  Eyes: Conjunctivae and EOM are normal. Pupils are equal, round, and reactive to light.  Neck: Normal range of motion. Neck supple.  Cardiovascular: Normal rate, regular rhythm,  normal heart sounds and intact distal pulses.  Pulmonary/Chest: Effort normal and breath sounds normal.  Abdominal: Soft. Bowel sounds are normal.  Musculoskeletal:       Lumbar back: She exhibits tenderness, pain and spasm.       Back:  Positive Phalen's and Tinel's  Neurological: She is alert and oriented to person, place, and time. She has normal reflexes.  Skin: Skin is warm and dry. No rash noted.  Psychiatric: She has a normal mood and affect. Her behavior is normal. Judgment and thought content normal.    Results for orders placed or performed in visit on 09/15/17  Magnesium  Result Value Ref Range   Magnesium 1.8 1.6 - 2.3 mg/dL  CMP14+EGFR  Result Value Ref Range   Glucose 157 (H) 65 - 99 mg/dL   BUN 12 6 - 24 mg/dL   Creatinine, Ser 0.68 0.57 - 1.00 mg/dL   GFR calc non Af Amer 102 >59 mL/min/1.73   GFR calc Af Amer 118 >59 mL/min/1.73   BUN/Creatinine Ratio 18 9 - 23   Sodium 137 134 - 144 mmol/L   Potassium 4.3 3.5 - 5.2 mmol/L   Chloride 98 96 - 106 mmol/L   CO2 22 20 - 29 mmol/L   Calcium 9.2 8.7 - 10.2 mg/dL   Total Protein 7.0 6.0 - 8.5 g/dL   Albumin 4.2 3.5 - 5.5 g/dL   Globulin, Total 2.8 1.5 - 4.5 g/dL   Albumin/Globulin Ratio 1.5 1.2 - 2.2   Bilirubin Total 0.4 0.0 - 1.2 mg/dL   Alkaline Phosphatase 89 39 - 117 IU/L   AST 17 0 - 40 IU/L   ALT 19 0 - 32 IU/L  Rocky mtn spotted fvr abs pnl(IgG+IgM)  Result Value Ref Range   RMSF IgG Negative  Negative   RMSF IgM 0.23 0.00 - 0.89 index  Thyroid Panel With TSH  Result Value Ref Range   TSH 3.200 0.450 - 4.500 uIU/mL   T4, Total 6.8 4.5 - 12.0 ug/dL   T3 Uptake Ratio 24 24 - 39 %   Free Thyroxine Index 1.6 1.2 - 4.9  Vitamin B12  Result Value Ref Range   Vitamin B-12 274 232 - 1,245 pg/mL  VITAMIN D 25 Hydroxy (Vit-D Deficiency, Fractures)  Result Value Ref Range   Vit D, 25-Hydroxy 20.7 (L) 30.0 - 100.0 ng/mL  Folate  Result Value Ref Range   Folate 8.7 >3.0 ng/mL  Arthritis Panel  Result Value Ref Range   Uric Acid 5.4 2.5 - 7.1 mg/dL   Rhuematoid fact SerPl-aCnc <10.0 0.0 - 13.9 IU/mL   WBC 13.0 (H) 3.4 - 10.8 x10E3/uL   RBC 4.66 3.77 - 5.28 x10E6/uL   Hemoglobin 15.6 11.1 - 15.9 g/dL   Hematocrit 46.0 34.0 - 46.6 %   MCV 99 (H) 79 - 97 fL   MCH 33.5 (H) 26.6 - 33.0 pg   MCHC 33.9 31.5 - 35.7 g/dL   RDW 13.0 12.3 - 15.4 %   Platelets 244 150 - 379 x10E3/uL   Neutrophils 67 Not Estab. %   Lymphs 25 Not Estab. %   Monocytes 7 Not Estab. %   Eos 1 Not Estab. %   Basos 0 Not Estab. %   Neutrophils Absolute 8.6 (H) 1.4 - 7.0 x10E3/uL   Lymphocytes Absolute 3.2 (H) 0.7 - 3.1 x10E3/uL   Monocytes Absolute 0.9 0.1 - 0.9 x10E3/uL   EOS (ABSOLUTE) 0.1 0.0 - 0.4 x10E3/uL   Basophils Absolute 0.1 0.0 -  0.2 x10E3/uL   Immature Granulocytes 0 Not Estab. %   Immature Grans (Abs) 0.0 0.0 - 0.1 x10E3/uL   Sed Rate 38 0 - 40 mm/hr  Protime-INR  Result Value Ref Range   INR 1.0 0.8 - 1.2   Prothrombin Time 10.2 9.1 - 12.0 sec      Assessment & Plan:   1. Myalgia - MR Lumbar Spine Wo Contrast; Future  2. Muscle spasm of both lower legs - MR Lumbar Spine Wo Contrast; Future  3. Lumbar degenerative disc disease - MR Lumbar Spine Wo Contrast; Future - HYDROcodone-acetaminophen (NORCO) 10-325 MG tablet; Take 1 tablet by mouth every 4 (four) hours as needed.  Dispense: 90 tablet; Refill: 0 - HYDROcodone-acetaminophen (NORCO) 10-325 MG tablet; Take 1 tablet by mouth  every 8 (eight) hours as needed.  Dispense: 90 tablet; Refill: 0 - HYDROcodone-acetaminophen (NORCO) 10-325 MG tablet; Take 1 tablet by mouth every 6 (six) hours as needed.  Dispense: 90 tablet; Refill: 0  4. Bone spur - MR Lumbar Spine Wo Contrast; Future  5. Leg weakness, bilateral - MR Lumbar Spine Wo Contrast; Future  6. Carpal tunnel syndrome on both sides - Ambulatory referral to Hand Surgery  7. Chronic right shoulder pain - HYDROcodone-acetaminophen (NORCO) 10-325 MG tablet; Take 1 tablet by mouth every 4 (four) hours as needed.  Dispense: 90 tablet; Refill: 0   Continue all other maintenance medications as listed above.  Follow up plan: No Follow-up on file.  Educational handout given for [place] PA-C [place] [address]  [place], [place] [address] [phone]   10/14/2017, 1:23 PM

## 2017-10-21 ENCOUNTER — Ambulatory Visit: Payer: Medicaid Other | Admitting: Pulmonary Disease

## 2017-10-22 ENCOUNTER — Ambulatory Visit (INDEPENDENT_AMBULATORY_CARE_PROVIDER_SITE_OTHER): Payer: Medicaid Other | Admitting: Orthopaedic Surgery

## 2017-10-24 ENCOUNTER — Ambulatory Visit (HOSPITAL_COMMUNITY): Payer: Medicaid Other | Attending: Physician Assistant

## 2017-10-28 ENCOUNTER — Ambulatory Visit (INDEPENDENT_AMBULATORY_CARE_PROVIDER_SITE_OTHER): Payer: Medicaid Other | Admitting: Orthopaedic Surgery

## 2017-10-28 ENCOUNTER — Telehealth (INDEPENDENT_AMBULATORY_CARE_PROVIDER_SITE_OTHER): Payer: Self-pay | Admitting: Orthopaedic Surgery

## 2017-10-28 ENCOUNTER — Encounter (INDEPENDENT_AMBULATORY_CARE_PROVIDER_SITE_OTHER): Payer: Self-pay | Admitting: Orthopaedic Surgery

## 2017-10-28 VITALS — BP 169/96 | HR 81 | Ht 65.0 in | Wt 352.0 lb

## 2017-10-28 DIAGNOSIS — M5442 Lumbago with sciatica, left side: Secondary | ICD-10-CM

## 2017-10-28 DIAGNOSIS — G8929 Other chronic pain: Secondary | ICD-10-CM

## 2017-10-28 DIAGNOSIS — M25512 Pain in left shoulder: Secondary | ICD-10-CM | POA: Diagnosis not present

## 2017-10-28 NOTE — Progress Notes (Signed)
Office Visit Note   Patient: Marissa [name]           Date of Birth: Aug 11, 1966           MRN: [id number] Visit Date: 10/28/2017              Requested by: Remus Loffler, PA-C [address] [place], [place] [address] PCP: Remus Loffler, PA-C   Assessment & Plan: Visit Diagnoses:  1. Chronic left-sided low back pain with left-sided sciatica   2. Chronic left shoulder pain     Plan: Marissa [name] was seen by Dr. Ophelia Charter in early December for evaluation of left hip pain. Cortisone injection of the greater trochanteric bursa did not really made much of a difference. She does have a history of degenerative disc disease of lumbar spine and that certainly is possible etiology for her hip pain. Marissa [name] has scheduled an MRI scan of her lumbar spine to be performed next week or 2. She continues to have the pain in her back and left hip. I evaluated Marissa [name] later in December for evaluation of left shoulder pain. She did have evidence of impingement. I injected the subacromial space and she notes shot only lasted several days. She's had difficulty raising her arm over her head" out to the side. She's taking ibuprofen 600 mg 4 times a day and "does not help. I think it's worth obtaining an MRI scan of her left shoulder and starting a course of physical therapy for the same. I am concerned about her taking medicine some multiple physicians. I would prefer that Marissa [name] prescribe her medicines She does have a history of fibromyalgia.   Follow-Up Instructions: Return after MRI left shoulder.   Orders:  No orders of the defined types were placed in this encounter.  No orders of the defined types were placed in this encounter.     Procedures: No procedures performed   Clinical Data: No additional findings.   Subjective: Chief Complaint  Patient presents with  . Left Shoulder - Pain, Follow-up    Marissa [name] is 52 y o f here for l shoulder inj F/U, INJ HELPED FOR A FEW DAYS THEN SYMPTOMS GOT  WORSE AND CANT RAISE ARM OUT TO SIDE.  Marland Kitchen Left Hip - Pain, Follow-up    LEFT HIP INJ HELPED FOR 1 WEEK, NOW IT IS WORSE LIKE BEFORE  Is persistent pain in the area of her left hip laterally and left shoulder as previously outlined. Angel Jones's schedule an MRI scan of the lumbar spine in the next week or 2. Previous history of degenerative disc disease by MRI scan. MRI scan in 2015 revealed progressive disc extrusion extending to the right lateral recess at L5-S1 with caudal down turning. Continued into the prominent right eccentric impingement L5-S1. There was mild left subarticular lateral recess stenosis at L4-5 due to a disc protrusion. Films of her left shoulder in December revealed what may be very minimal early spurring at the inferior aspect of the humeral head possibly consistent with some early arthritis. She did not do well with a subacromial cortisone injection and is still having trouble raising her arm over her head. Numbness or tingling  HPI  Review of Systems  Constitutional: Positive for fatigue. Negative for fever.  HENT: Negative for ear pain and tinnitus.   Eyes: Negative for pain.  Respiratory: Negative for cough and shortness of breath.   Cardiovascular: Positive for leg swelling. Negative for chest pain and palpitations.  Gastrointestinal: Negative for blood in stool, constipation and diarrhea.  Genitourinary: Negative for dysuria.  Musculoskeletal: Positive for back pain, myalgias and neck pain.  Skin: Negative for rash and wound.  Allergic/Immunologic: Negative for food allergies.  Neurological: Positive for weakness and headaches. Negative for dizziness, light-headedness and numbness.  Hematological: Bruises/bleeds easily.  Psychiatric/Behavioral: Positive for sleep disturbance. The patient is nervous/anxious.      Objective: Vital Signs: BP (!) 169/96 (BP Location: Left Arm, Patient Position: Sitting, Cuff Size: Normal)   Pulse 81   Ht 5\' 5"  (1.651 m)   Wt  (!) 352 lb (159.7 kg)   BMI 58.58 kg/m   Physical Exam  Ortho Exam awake alert and oriented 3. Does have some pain in the area of the greater trochanter of the left hip. He is morbidly obese with a BMI of 59.Marland Kitchen. Been negative. Left shoulder was uncomfortable in almost any motion. She did have a number of areas of trigger point tenderness are think is consistent with her fibromyalgia. I could slowly raise her arm over her head passively but she could not perform that activity actively. Positive impingement. Skin was intact. Good grip and good release. No pain referable to the cervical spine.  Specialty Comments:  No specialty comments available.  Imaging: No results found.   PMFS History: Patient Active Problem List   Diagnosis Date Noted  . Carpal tunnel syndrome on both sides 10/14/2017  . Muscle spasm of both lower legs 10/13/2017  . Lumbar degenerative disc disease 10/13/2017  . Bone spur 10/13/2017  . Leg weakness, bilateral 10/13/2017  . Right shoulder pain 08/07/2017  . Joint pain 07/08/2017  . Myalgia 07/08/2017  . Pulmonary emphysema ([facility]) 07/08/2017  . Chronic congestive heart failure ([facility]) 07/08/2017  . Encounter for smoking cessation counseling 07/08/2017  . Essential hypertension 07/08/2017  . Migraine without aura, with intractable migraine, so stated, without mention of status migrainosus 06/21/2013  . Neck pain 06/21/2013  . Morbid obesity ([facility]) 06/21/2013   Past Medical History:  Diagnosis Date  . Arthritis   . Asthma   . Atelectasis pulmonary   . Back pain   . CHF (congestive heart failure) ([facility])    11/2016  . Chronic back pain   . COPD (chronic obstructive pulmonary disease) ([facility])   . Depression   . Depression with anxiety   . Dyspnea   . Emphysema lung ([facility])   . Emphysema lung ([facility])   . Fibromyalgia   . GERD (gastroesophageal reflux disease)   . Headache(784.0)    HX MIGRAINES  . Hypercholesterolemia   . Hypertension   . Insomnia   . Morbid  obesity ([facility])   . Pneumonia    MARCH   2018  . Requires supplemental oxygen    3 LITERS AT HS  . Sleep apnea    BIPAP +  O2    Family History  Problem Relation Age of Onset  . Congestive Heart Failure Mother   . Hypertension Mother     Past Surgical History:  Procedure Laterality Date  . CESAREAN SECTION    . CHOLECYSTECTOMY    . FOOT SURGERY    . GALLBLADDER SURGERY    . TONSILLECTOMY    . TOOTH EXTRACTION N/A 01/30/2017   Procedure: EXTRACTION TEETH 15, 17, 31;  Surgeon: Ocie DoyneJensen, Scott, DDS;  Location: [facility] OR;  Service: Oral Surgery;  Laterality: N/A;   Social History   Occupational History    Employer: OTHER    Comment: n/a  Tobacco  Use  . Smoking status: Current Every Day Smoker    Packs/day: 0.25    Years: 35.00    Pack years: 8.75    Types: Cigarettes  . Smokeless tobacco: Never Used  . Tobacco comment: 3 Cigarettes daily-01/27/17  Substance and Sexual Activity  . Alcohol use: No  . Drug use: No  . Sexual activity: Yes

## 2017-10-28 NOTE — Telephone Encounter (Signed)
Patient called stating that Dr. Cleophas DunkerWhitfield referred her to [facility] in [place].  Patient states she needs a prescription faxed to [phone]

## 2017-10-29 NOTE — Telephone Encounter (Signed)
Can you please send me a rx for her thearpy? Thank you

## 2017-10-29 NOTE — Telephone Encounter (Signed)
Yes, send prescripotion

## 2017-10-30 NOTE — Telephone Encounter (Signed)
Faxed rx order to [phone]

## 2017-10-31 ENCOUNTER — Ambulatory Visit (HOSPITAL_COMMUNITY)
Admission: RE | Admit: 2017-10-31 | Discharge: 2017-10-31 | Disposition: A | Discharge status: Home / Self Care | Payer: Medicaid Other | Source: Ambulatory Visit | Attending: Physician Assistant | Admitting: Physician Assistant

## 2017-10-31 DIAGNOSIS — M5136 Other intervertebral disc degeneration, lumbar region: Secondary | ICD-10-CM

## 2017-10-31 DIAGNOSIS — M779 Enthesopathy, unspecified: Secondary | ICD-10-CM

## 2017-10-31 DIAGNOSIS — R29898 Other symptoms and signs involving the musculoskeletal system: Secondary | ICD-10-CM | POA: Diagnosis not present

## 2017-10-31 DIAGNOSIS — M5127 Other intervertebral disc displacement, lumbosacral region: Secondary | ICD-10-CM | POA: Diagnosis not present

## 2017-10-31 DIAGNOSIS — M5126 Other intervertebral disc displacement, lumbar region: Secondary | ICD-10-CM | POA: Insufficient documentation

## 2017-10-31 DIAGNOSIS — N83201 Unspecified ovarian cyst, right side: Secondary | ICD-10-CM | POA: Diagnosis not present

## 2017-10-31 DIAGNOSIS — M62838 Other muscle spasm: Secondary | ICD-10-CM | POA: Insufficient documentation

## 2017-10-31 DIAGNOSIS — M791 Myalgia, unspecified site: Secondary | ICD-10-CM

## 2017-11-02 ENCOUNTER — Other Ambulatory Visit: Payer: Self-pay | Admitting: *Deleted

## 2017-11-02 DIAGNOSIS — M5127 Other intervertebral disc displacement, lumbosacral region: Secondary | ICD-10-CM

## 2017-11-03 ENCOUNTER — Ambulatory Visit: Payer: Medicaid Other | Admitting: Pulmonary Disease

## 2017-11-04 ENCOUNTER — Encounter: Payer: Self-pay | Admitting: Physician Assistant

## 2017-11-04 ENCOUNTER — Ambulatory Visit: Payer: Medicaid Other | Admitting: Physician Assistant

## 2017-11-04 VITALS — BP 138/90 | HR 88 | Temp 99.3°F | Ht 65.0 in | Wt 357.6 lb

## 2017-11-04 DIAGNOSIS — J440 Chronic obstructive pulmonary disease with acute lower respiratory infection: Secondary | ICD-10-CM | POA: Diagnosis not present

## 2017-11-04 DIAGNOSIS — R29898 Other symptoms and signs involving the musculoskeletal system: Secondary | ICD-10-CM | POA: Diagnosis not present

## 2017-11-04 DIAGNOSIS — M5136 Other intervertebral disc degeneration, lumbar region: Secondary | ICD-10-CM

## 2017-11-04 DIAGNOSIS — J209 Acute bronchitis, unspecified: Secondary | ICD-10-CM

## 2017-11-04 DIAGNOSIS — M791 Myalgia, unspecified site: Secondary | ICD-10-CM

## 2017-11-04 DIAGNOSIS — G8929 Other chronic pain: Secondary | ICD-10-CM

## 2017-11-04 DIAGNOSIS — I10 Essential (primary) hypertension: Secondary | ICD-10-CM

## 2017-11-04 DIAGNOSIS — M25511 Pain in right shoulder: Secondary | ICD-10-CM

## 2017-11-04 MED ORDER — METHYLPREDNISOLONE ACETATE 80 MG/ML IJ SUSP
80.0000 mg | Freq: Once | INTRAMUSCULAR | Status: AC
Start: 2017-11-04 — End: 2017-11-04
  Administered 2017-11-04: 80 mg via INTRAMUSCULAR

## 2017-11-04 MED ORDER — DOXYCYCLINE HYCLATE 100 MG PO TABS: 100.0000 mg | ORAL_TABLET | Freq: Two times a day (BID) | ORAL | 0 refills | Status: DC

## 2017-11-04 MED ORDER — HYDROCODONE-ACETAMINOPHEN 10-325 MG PO TABS: 1.0000 | ORAL_TABLET | Freq: Three times a day (TID) | ORAL | 0 refills | Status: AC | PRN

## 2017-11-04 MED ORDER — HYDROCODONE-ACETAMINOPHEN 10-325 MG PO TABS: 1.0000 | ORAL_TABLET | Freq: Four times a day (QID) | ORAL | 0 refills | Status: AC | PRN

## 2017-11-04 MED ORDER — HYDROCODONE-ACETAMINOPHEN 10-325 MG PO TABS: 1.0000 | ORAL_TABLET | ORAL | 0 refills | Status: DC | PRN

## 2017-11-04 NOTE — Patient Instructions (Signed)
In a few days you may receive a survey in the mail or online from [employer] regarding your visit with us today. Please take a moment to fill this out. Your feedback is very important to our whole office. It can help us better understand your needs as well as improve your experience and satisfaction. Thank you for taking your time to complete it. We care about you.  Jolinda Pinkstaff, PA-C  

## 2017-11-05 NOTE — Progress Notes (Signed)
BP 138/90   Pulse 88   Temp 99.3 F (37.4 C) (Oral)   Ht _0  (1.651 m)   Wt (!) 357 lb 9.6 oz (162.2 kg)   LMP 10/31/2017   BMI 59.51 kg/m    Subjective:    Patient ID: Marissa [name], female    DOB: 1966-01-12, 52 y.o.   MRN: [id number]  HPI: Marissa [name] is a 52 y.o. female presenting on 11/04/2017 for Pain (in legs ) and Cough  This patient comes in for periodic recheck on medications and conditions including myalgia, hypertension, weakness, DDD, shoulder pain, bronchitis. She has continued with the above conditions and pain. Patient with several days of progressing upper respiratory and bronchial symptoms. Initially there was more upper respiratory congestion. This progressed to having significant cough that is productive throughout the day and severe at night. There is occasional wheezing after coughing. Sometimes there is slight dyspnea on exertion. It is productive mucus that is yellow in color. Denies any blood. .   All medications are reviewed today. There are no reports of any problems with the medications. All of the medical conditions are reviewed and updated.  Lab work is reviewed and will be ordered as medically necessary. There are no new problems reported with today's visit.   Past Medical History:  Diagnosis Date  . Arthritis   . Asthma   . Atelectasis pulmonary   . Back pain   . CHF (congestive heart failure) ([place])    11/2016  . Chronic back pain   . COPD (chronic obstructive pulmonary disease) ([place])   . Depression   . Depression with anxiety   . Dyspnea   . Emphysema lung ([place])   . Emphysema lung ([place])   . Fibromyalgia   . GERD (gastroesophageal reflux disease)   . Headache(784.0)    HX MIGRAINES  . Hypercholesterolemia   . Hypertension   . Insomnia   . Morbid obesity ([place])   . Pneumonia    MARCH   2018  . Requires supplemental oxygen    3 LITERS AT HS  . Sleep apnea    BIPAP +  O2   Relevant past medical, surgical, family and social history  reviewed and updated as indicated. Interim medical history since our last visit reviewed. Allergies and medications reviewed and updated. DATA REVIEWED: CHART IN EPIC  Family History reviewed for pertinent findings.  Review of Systems  Constitutional: Positive for chills and fatigue. Negative for activity change and appetite change.  HENT: Positive for congestion, postnasal drip and sore throat.   Eyes: Negative.   Respiratory: Positive for cough and wheezing.   Cardiovascular: Negative.  Negative for chest pain, palpitations and leg swelling.  Gastrointestinal: Negative.   Genitourinary: Negative.   Musculoskeletal: Positive for arthralgias, back pain, joint swelling and myalgias.  Skin: Negative.   Neurological: Positive for headaches.    Allergies as of 11/04/2017      Reactions   Advera [compleat] Itching   Throat Itching   Relpax [eletriptan] Other (See Comments)   Weakness, couldn't move   Advair Diskus [fluticasone-salmeterol]    Ativan [lorazepam]    Citalopram    Morphine And Related Itching      Medication List        Accurate as of 11/04/17 11:59 PM. Always use your most recent med list.          albuterol (2.5 MG/3ML) 0.083% nebulizer solution Commonly known as:  PROVENTIL Take 2.5 mg by nebulization every 6 (six)  hours as needed for wheezing or shortness of breath.   albuterol 108 (90 Base) MCG/ACT inhaler Commonly known as:  PROVENTIL HFA;VENTOLIN HFA Inhale 2 puffs into the lungs every 4 (four) hours as needed for wheezing or shortness of breath.   ALPRAZolam 1 MG tablet Commonly known as:  XANAX Take 1 mg by mouth 3 (three) times daily.   doxycycline 100 MG tablet Commonly known as:  VIBRA-TABS Take 1 tablet (100 mg total) by mouth 2 (two) times daily. 1 po bid   furosemide 20 MG tablet Commonly known as:  LASIX TAKE (1) TABLET BY MOUTH ONCE DAILY AS NEEDED.   gabapentin 300 MG capsule Commonly known as:  NEURONTIN Take 1-3 capsules (300-900  mg total) at bedtime by mouth.   HYDROcodone-acetaminophen 10-325 MG tablet Commonly known as:  NORCO Take 1 tablet by mouth every 6 (six) hours as needed.   HYDROcodone-acetaminophen 10-325 MG tablet Commonly known as:  NORCO Take 1 tablet by mouth every 8 (eight) hours as needed. Start taking on:  12/04/2017   HYDROcodone-acetaminophen 10-325 MG tablet Commonly known as:  NORCO Take 1 tablet by mouth every 4 (four) hours as needed. Start taking on:  01/02/2018   ibuprofen 200 MG tablet Commonly known as:  ADVIL,MOTRIN Take 800 mg by mouth every 8 (eight) hours as needed for mild pain or moderate pain.   LATUDA 60 MG Tabs Generic drug:  Lurasidone HCl Take 60 mg daily by mouth.   lisinopril 10 MG tablet Commonly known as:  PRINIVIL,ZESTRIL Take 1 tablet (10 mg total) by mouth 2 (two) times daily.   metFORMIN 500 MG tablet Commonly known as:  GLUCOPHAGE Take 1 tablet (500 mg total) by mouth daily with breakfast.   multivitamin with minerals tablet Take 1 tablet by mouth daily.   NON FORMULARY Pt uses CPAP machine at bedtime   OXYGEN Inhale 3 L into the lungs at bedtime.   Potassium 99 MG Tabs Take 1 tablet by mouth every other day.   rOPINIRole 1 MG tablet Commonly known as:  REQUIP Take 1-2 tablets (1-2 mg total) by mouth at bedtime.   SYMBICORT 160-4.5 MCG/ACT inhaler Generic drug:  budesonide-formoterol Inhale 2 puffs into the lungs 2 (two) times daily.   tiZANidine 4 MG tablet Commonly known as:  ZANAFLEX TAKE ONE (1) TABLET THREE (3) TIMES EACH DAY   Vitamin D (Ergocalciferol) 50000 units Caps capsule Commonly known as:  DRISDOL Take 1 capsule (50,000 Units total) by mouth every 7 (seven) days.   ZYRTEC ALLERGY 10 MG Caps Generic drug:  Cetirizine HCl Take 10 mg by mouth daily.          Objective:    BP 138/90   Pulse 88   Temp 99.3 F (37.4 C) (Oral)   Ht _0  (1.651 m)   Wt (!) 357 lb 9.6 oz (162.2 kg)   LMP 10/31/2017   BMI 59.51  kg/m   Allergies  Allergen Reactions  . Advera [Compleat] Itching    Throat Itching  . Relpax [Eletriptan] Other (See Comments)    Weakness, couldn't move  . Advair Diskus [Fluticasone-Salmeterol]   . Ativan [Lorazepam]   . Citalopram   . Morphine And Related Itching    Wt Readings from Last 3 Encounters:  11/04/17 (!) 357 lb 9.6 oz (162.2 kg)  10/28/17 (!) 352 lb (159.7 kg)  10/13/17 (!) 362 lb 3.2 oz (164.3 kg)    Physical Exam  Constitutional: She is oriented to person, place, and  time. She appears well-developed and well-nourished.  HENT:  Head: Normocephalic and atraumatic.  Right Ear: There is drainage and tenderness.  Left Ear: There is drainage and tenderness.  Nose: Mucosal edema and rhinorrhea present. Right sinus exhibits no maxillary sinus tenderness and no frontal sinus tenderness. Left sinus exhibits no maxillary sinus tenderness and no frontal sinus tenderness.  Mouth/Throat: Oropharyngeal exudate and posterior oropharyngeal erythema present.  Eyes: Conjunctivae and EOM are normal. Pupils are equal, round, and reactive to light.  Neck: Normal range of motion. Neck supple.  Cardiovascular: Normal rate, regular rhythm, normal heart sounds and intact distal pulses.  Pulmonary/Chest: Effort normal. She has wheezes in the right upper field and the left upper field.  Abdominal: Soft. Bowel sounds are normal.  Neurological: She is alert and oriented to person, place, and time. She has normal reflexes.  Skin: Skin is warm and dry. No rash noted.  Psychiatric: She has a normal mood and affect. Her behavior is normal. Judgment and thought content normal.  Nursing note and vitals reviewed.   Results for orders placed or performed in visit on 09/15/17  Magnesium  Result Value Ref Range   Magnesium 1.8 1.6 - 2.3 mg/dL  CMP14+EGFR  Result Value Ref Range   Glucose 157 (H) 65 - 99 mg/dL   BUN 12 6 - 24 mg/dL   Creatinine, Ser 0.68 0.57 - 1.00 mg/dL   GFR calc non Af  Amer 102 >59 mL/min/1.73   GFR calc Af Amer 118 >59 mL/min/1.73   BUN/Creatinine Ratio 18 9 - 23   Sodium 137 134 - 144 mmol/L   Potassium 4.3 3.5 - 5.2 mmol/L   Chloride 98 96 - 106 mmol/L   CO2 22 20 - 29 mmol/L   Calcium 9.2 8.7 - 10.2 mg/dL   Total Protein 7.0 6.0 - 8.5 g/dL   Albumin 4.2 3.5 - 5.5 g/dL   Globulin, Total 2.8 1.5 - 4.5 g/dL   Albumin/Globulin Ratio 1.5 1.2 - 2.2   Bilirubin Total 0.4 0.0 - 1.2 mg/dL   Alkaline Phosphatase 89 39 - 117 IU/L   AST 17 0 - 40 IU/L   ALT 19 0 - 32 IU/L  Rock[place] mtn spotted fvr abs pnl(IgG+IgM)  Result Value Ref Range   RMSF IgG Negative Negative   RMSF IgM 0.23 0.00 - 0.89 index  Thyroid Panel With TSH  Result Value Ref Range   TSH 3.200 0.450 - 4.500 uIU/mL   T4, Total 6.8 4.5 - 12.0 ug/dL   T3 Uptake Ratio 24 24 - 39 %   Free Thyroxine Index 1.6 1.2 - 4.9  Vitamin B12  Result Value Ref Range   Vitamin B-12 274 232 - 1,245 pg/mL  VITAMIN D 25 Hydroxy (Vit-D Deficiency, Fractures)  Result Value Ref Range   Vit D, 25-Hydroxy 20.7 (L) 30.0 - 100.0 ng/mL  Folate  Result Value Ref Range   Folate 8.7 >3.0 ng/mL  Arthritis Panel  Result Value Ref Range   Uric Acid 5.4 2.5 - 7.1 mg/dL   Rhuematoid fact SerPl-aCnc <10.0 0.0 - 13.9 IU/mL   WBC 13.0 (H) 3.4 - 10.8 x10E3/uL   RBC 4.66 3.77 - 5.28 x10E6/uL   Hemoglobin 15.6 11.1 - 15.9 g/dL   Hematocrit 46.0 34.0 - 46.6 %   MCV 99 (H) 79 - 97 fL   MCH 33.5 (H) 26.6 - 33.0 pg   MCHC 33.9 31.5 - 35.7 g/dL   RDW 13.0 12.3 - 15.4 %   Platelets 244  150 - 379 x10E3/uL   Neutrophils 67 Not Estab. %   Lymphs 25 Not Estab. %   Monocytes 7 Not Estab. %   Eos 1 Not Estab. %   Basos 0 Not Estab. %   Neutrophils Absolute 8.6 (H) 1.4 - 7.0 x10E3/uL   Lymphocytes Absolute 3.2 (H) 0.7 - 3.1 x10E3/uL   Monocytes Absolute 0.9 0.1 - 0.9 x10E3/uL   EOS (ABSOLUTE) 0.1 0.0 - 0.4 x10E3/uL   Basophils Absolute 0.1 0.0 - 0.2 x10E3/uL   Immature Granulocytes 0 Not Estab. %   Immature Grans (Abs) 0.0  0.0 - 0.1 x10E3/uL   Sed Rate 38 0 - 40 mm/hr  Protime-INR  Result Value Ref Range   INR 1.0 0.8 - 1.2   Prothrombin Time 10.2 9.1 - 12.0 sec      Assessment & Plan:   1. Myalgia  2. Essential hypertension  3. Leg weakness, bilateral  4. Lumbar degenerative disc disease - HYDROcodone-acetaminophen (NORCO) 10-325 MG tablet; Take 1 tablet by mouth every 4 (four) hours as needed.  Dispense: 120 tablet; Refill: 0 - HYDROcodone-acetaminophen (NORCO) 10-325 MG tablet; Take 1 tablet by mouth every 8 (eight) hours as needed.  Dispense: 120 tablet; Refill: 0 - HYDROcodone-acetaminophen (NORCO) 10-325 MG tablet; Take 1 tablet by mouth every 6 (six) hours as needed.  Dispense: 120 tablet; Refill: 0  5. Chronic right shoulder pain - HYDROcodone-acetaminophen (NORCO) 10-325 MG tablet; Take 1 tablet by mouth every 4 (four) hours as needed.  Dispense: 120 tablet; Refill: 0  6. Acute bronchitis with COPD ([place]) - methylPREDNISolone acetate (DEPO-MEDROL) injection 80 mg - doxycycline (VIBRA-TABS) 100 MG tablet; Take 1 tablet (100 mg total) by mouth 2 (two) times daily. 1 po bid  Dispense: 20 tablet; Refill: 0   Continue all other maintenance medications as listed above.  Follow up plan: Return in about 3 months (around 02/04/2018) for recheck.  Educational handout given for [facility] PA-C [place] [address]  [place], [place] [address] [phone]   11/05/2017, 10:33 PM

## 2017-11-10 ENCOUNTER — Telehealth: Payer: Self-pay | Admitting: Physician Assistant

## 2017-11-11 ENCOUNTER — Telehealth: Payer: Self-pay | Admitting: Physician Assistant

## 2017-11-11 NOTE — Telephone Encounter (Signed)
Pt aware of appointment date/time but states she will need to reschedule

## 2017-11-19 ENCOUNTER — Ambulatory Visit: Payer: Medicaid Other | Admitting: Pulmonary Disease

## 2017-12-02 ENCOUNTER — Telehealth: Payer: Self-pay | Admitting: Physician Assistant

## 2017-12-02 ENCOUNTER — Other Ambulatory Visit: Payer: Self-pay | Admitting: Physician Assistant

## 2017-12-02 DIAGNOSIS — J209 Acute bronchitis, unspecified: Secondary | ICD-10-CM

## 2017-12-02 DIAGNOSIS — J440 Chronic obstructive pulmonary disease with acute lower respiratory infection: Principal | ICD-10-CM

## 2017-12-02 MED ORDER — DOXYCYCLINE HYCLATE 100 MG PO TABS: 100.0000 mg | ORAL_TABLET | Freq: Two times a day (BID) | ORAL | 0 refills | Status: DC

## 2017-12-02 NOTE — Telephone Encounter (Signed)
Patient aware rx sent to pharmacy.  

## 2017-12-02 NOTE — Telephone Encounter (Signed)
Sent doxycycline.

## 2017-12-21 ENCOUNTER — Encounter: Payer: Self-pay | Admitting: Neurology

## 2017-12-21 ENCOUNTER — Ambulatory Visit: Payer: Medicaid Other | Admitting: Neurology

## 2017-12-21 VITALS — BP 146/84 | HR 81 | Ht 66.0 in | Wt 364.0 lb

## 2017-12-21 DIAGNOSIS — I10 Essential (primary) hypertension: Secondary | ICD-10-CM | POA: Diagnosis not present

## 2017-12-21 DIAGNOSIS — F319 Bipolar disorder, unspecified: Secondary | ICD-10-CM | POA: Diagnosis not present

## 2017-12-21 DIAGNOSIS — G43709 Chronic migraine without aura, not intractable, without status migrainosus: Secondary | ICD-10-CM

## 2017-12-21 DIAGNOSIS — F17200 Nicotine dependence, unspecified, uncomplicated: Secondary | ICD-10-CM | POA: Diagnosis not present

## 2017-12-21 MED ORDER — NAPROXEN SODIUM 550 MG PO TABS: 550.0000 mg | ORAL_TABLET | Freq: Two times a day (BID) | ORAL | 3 refills | Status: DC | PRN

## 2017-12-21 NOTE — Patient Instructions (Addendum)
Migraine Recommendations: 1.  We will get pre-authorization to start Botox. 2.  Take naproxen  at earliest onset of headache.  May repeat dose once in 12 hours if needed.  Do not exceed two doses in 24 hours. 3.  Stop other pain relievers if possible.  Do not treat the migraine with hydrocodone.  Limit use of pain relievers to no more than 2 days out of the week.  These medications include acetaminophen, ibuprofen, triptans and narcotics.  This will help reduce risk of rebound headaches. 4.  Be aware of common food triggers such as processed sweets, processed foods with nitrites (such as deli meat, hot dogs, sausages), foods with MSG, alcohol (such as wine), chocolate, certain cheeses, certain fruits (dried fruits, bananas, some citrus fruit), vinegar, diet soda. 4.  Avoid caffeine 5.  Routine exercise 6.  Proper sleep hygiene 7.  Stay adequately hydrated with water 8.  Keep a headache diary. 9.  Maintain proper stress management. 10.  Proper diet.  Do not skip meals.  Try to work on weight loss 11.  Consider supplements:  Magnesium citrate  to  daily, riboflavin , Coenzyme Q 10  three times daily 12.  Try to continue working on quitting smoking   Recurrent Migraine Headache A migraine headache is very bad, throbbing pain that is usually on one side of your head. Recurrent migraines keep coming back (recurring). Talk with your doctor about what things may bring on (trigger) your migraine headaches. Follow these instructions at home: Medicines  Take over-the-counter and prescription medicines only as told by your doctor.  Do not drive or use heavy machinery while taking prescription pain medicine. Lifestyle  Do not use any products that contain nicotine or tobacco, such as cigarettes and e-cigarettes. If you need help quitting, ask your doctor.  Limit alcohol intake to no more than 1 drink a day for nonpregnant women and 2 drinks a day for men. One drink equals 12  oz of beer, 5 oz of wine, or 1 oz of hard liquor.  Get 7-9 hours of sleep each night.  Lessen any stress in your life. Ask your doctor about ways to lower your stress.  Stay at a healthy weight. Talk with your doctor if you need help losing weight.  Get regular exercise. General instructions  Keep a journal to find out if certain things bring on migraine headaches. For example, write down: ? What you eat and drink. ? How much sleep you get. ? Any change to your diet or medicines.  Lie down in a dark, quiet room when you have a migraine.  Try placing a cool towel over your head when you have a migraine.  Keep lights dim if bright lights bother you or make your migraines worse.  Keep all follow-up visits as told by your doctor. This is important. Contact a doctor if:  Medicine does not help your migraines.  Your pain keeps coming back.  You have a fever.  You have weight loss without trying. Get help right away if:  Your migraine becomes really bad and medicine does not help.  You have a stiff neck.  You have trouble seeing.  Your muscles are weak or you lose control of your muscles.  You lose your balance or have trouble walking.  You feel like you will pass out (faint) or you pass out.  You have really bad symptoms that are different than your first symptoms.  You start having sudden, very bad headaches that last  for one second or less, like a thunderclap. Summary  A migraine headache is very bad, throbbing pain that is usually on one side of your head.  Talk with your doctor about what things may bring on (trigger) your migraine headaches.  Take over-the-counter and prescription medicines only as told by your doctor.  Lie down in a dark, quiet room when you have a migraine.  Keep a journal about what you eat and drink, how much sleep you get, and any changes to your medicines. This can help you find out if certain things make you have migraine  headaches. This information is not intended to replace advice given to you by your health care provider. Make sure you discuss any questions you have with your health care provider. Document Released: 05/20/2008 Document Revised: 07/04/2016 Document Reviewed: 07/04/2016 Elsevier Interactive Patient Education  2017 [employer].

## 2017-12-21 NOTE — Progress Notes (Signed)
Called and spoke with Lilly at [facility] in [place] [phone], she is to fax recent notes to our clinical fax

## 2017-12-21 NOTE — Progress Notes (Signed)
NEUROLOGY CONSULTATION NOTE  Marissa [name] MRN: [id number] DOB: 09/28/1965  Referring provider: Prudy Feeler, PA-C Primary care provider: Prudy Feeler, PA-C  Reason for consult:  migraines  HISTORY OF PRESENT ILLNESS: Marissa [name] is a 52 year old female with hypertension, COPD, emphysema, Bipolar 1 disorder, PTSD, myalgias/fibromyalgia, chronic back pain, fatigue and morbid obesity who presents for migraines.  She is accompanied by her daughter who supplements history.  Onset:  Around age 76.  They dissipated a couple of years ago but returned in September 2018. Location:  Varies:  Front, back, bilateral, unilateral.  Often, she has right sided posterior neck pain that radiates up to the occipital region. Quality:  Varies:  Pounding, shooting Intensity:  Severe.  She denies new headache, thunderclap headache or severe headache that wakes her from sleep.  She does wake up with headache. Aura:  no Prodrome:  no Postdrome:  no Associated symptoms:  Nausea, photophobia, phonobia.  She denies associated vomiting, visual disturbance or unilateral numbness or weakness. Duration:  Usually 3-4 hours up to 1 day but range from several minutes to 3 days Frequency:  Total of 15 headache days per month Frequency of abortive medication: she takes Norco daily (for chronic pain as well) Triggers/exacerbating factors:  no Relieving factors:  no Activity:  aggravates  Current NSAIDS:  no Current analgesics:  Norco Current triptans:  no Current anti-emetic:  no Current muscle relaxants:  tizanidine  Current anti-anxiolytic:  alprazolam Current sleep aide:  no Current Antihypertensive medications:  Furosemide as needed Current Antidepressant medications:  no Current Anticonvulsant medications:  no Current Vitamins/Herbal/Supplements:  D Current Antihistamines/Decongestants:  Zyrtec Other therapy:  no Other medication:  Latuda, ropinirole  CT head from 09/01/13 was personally reviewed and was  normal.  Past NSAIDS:  Ibuprofen  Past analgesics:  Tramadol, Tylenol, Excedrin Migraine Past abortive triptans:  Relpax (caused hallucinations, delusions and diffuse weakness) Past muscle relaxants:  Cyclobenzaprine, Robaxin, Soma, baclofen Past anti-emetic:  Promethazine, Zofran Past antihypertensive medications:  lisinopril Past antidepressant medications:  Nortriptyline, Effexor, sertraline, escitalopram  Past anticonvulsant medications:  topiramate  daily (ineffective), gabapentin 300-900mg  at bedtime (leg swelling) Past vitamins/Herbal/Supplements:  no Past antihistamines/decongestants:  no Other past therapies:  no  Caffeine:  no Alcohol:  no Smoker:  yes Diet:  Hydrates.  No soda.  Does not follow diet.  Exercise:  no Depression:  yes; Anxiety:  Yes.   Other pain:  Diffuse pain, back pain, neck pain, myalgias Sleep hygiene:  Poor.  Has OSA on BiPAP  She has myalgias that have been worked up.  EMG reportedly has shown neuropathy but no evidence of myopathy.  She has degenerative disc disease of the cervical and lumbar spine as well as arthritis.  She is diagnosed with fibromyalgia.  She was evaluated by optometry last May and with no findings (such as papilledema) other than need for new prescription.   PAST MEDICAL HISTORY: Past Medical History:  Diagnosis Date  . Arthritis   . Asthma   . Atelectasis pulmonary   . Back pain   . CHF (congestive heart failure) (HCC)    11/2016  . Chronic back pain   . COPD (chronic obstructive pulmonary disease) (HCC)   . Depression   . Depression with anxiety   . Dyspnea   . Emphysema lung (HCC)   . Emphysema lung (HCC)   . Fibromyalgia   . GERD (gastroesophageal reflux disease)   . Headache(784.0)    HX MIGRAINES  . Hypercholesterolemia   .  Hypertension   . Insomnia   . Morbid obesity (HCC)   . Pneumonia    MARCH   2018  . Requires supplemental oxygen    3 LITERS AT HS  . Sleep apnea    BIPAP +  O2     PAST SURGICAL HISTORY: Past Surgical History:  Procedure Laterality Date  . CESAREAN SECTION    . CHOLECYSTECTOMY    . FOOT SURGERY    . GALLBLADDER SURGERY    . TONSILLECTOMY    . TOOTH EXTRACTION N/A 01/30/2017   Procedure: EXTRACTION TEETH 15, 17, 31;  Surgeon: Ocie Doyne, DDS;  Location: [facility] OR;  Service: Oral Surgery;  Laterality: N/A;    MEDICATIONS: Current Outpatient Medications on File Prior to Visit  Medication Sig Dispense Refill  . albuterol (PROVENTIL HFA;VENTOLIN HFA) 108 (90 BASE) MCG/ACT inhaler Inhale 2 puffs into the lungs every 4 (four) hours as needed for wheezing or shortness of breath. 1 Inhaler 2  . albuterol (PROVENTIL) (2.5 MG/3ML) 0.083% nebulizer solution Take 2.5 mg by nebulization every 6 (six) hours as needed for wheezing or shortness of breath.    . ALPRAZolam (XANAX) 1 MG tablet Take 1 mg by mouth 3 (three) times daily.     . Cetirizine HCl (ZYRTEC ALLERGY) 10 MG CAPS Take 10 mg by mouth daily.     Marland Kitchen doxycycline (VIBRA-TABS) 100 MG tablet Take 1 tablet (100 mg total) by mouth 2 (two) times daily. 1 po bid (Patient not taking: Reported on 12/21/2017) 20 tablet 0  . furosemide (LASIX) 20 MG tablet TAKE (1) TABLET BY MOUTH ONCE DAILY AS NEEDED. 30 tablet 11  . gabapentin (NEURONTIN) 300 MG capsule Take 1-3 capsules (300-900 mg total) at bedtime by mouth. (Patient not taking: Reported on 12/21/2017) 90 capsule 3  . [START ON 01/02/2018] HYDROcodone-acetaminophen (NORCO) 10-325 MG tablet Take 1 tablet by mouth every 4 (four) hours as needed. 120 tablet 0  . HYDROcodone-acetaminophen (NORCO) 10-325 MG tablet Take 1 tablet by mouth every 8 (eight) hours as needed. 120 tablet 0  . ibuprofen (ADVIL,MOTRIN) 200 MG tablet Take 800 mg by mouth every 8 (eight) hours as needed for mild pain or moderate pain.    Marland Kitchen lisinopril (PRINIVIL,ZESTRIL) 10 MG tablet Take 1 tablet (10 mg total) by mouth 2 (two) times daily. (Patient not taking: Reported on 12/21/2017) 30 tablet 11   . Lurasidone HCl (LATUDA) 60 MG TABS Take 60 mg daily by mouth.    . metFORMIN (GLUCOPHAGE) 500 MG tablet Take 1 tablet (500 mg total) by mouth daily with breakfast. (Patient not taking: Reported on 12/21/2017) 30 tablet 5  . Multiple Vitamins-Minerals (MULTIVITAMIN WITH MINERALS) tablet Take 1 tablet by mouth daily.    . NON FORMULARY Pt uses CPAP machine at bedtime    . OXYGEN Inhale 3 L into the lungs at bedtime.    . Potassium 99 MG TABS Take 1 tablet by mouth every other day.    Marland Kitchen rOPINIRole (REQUIP) 1 MG tablet Take 1-2 tablets (1-2 mg total) by mouth at bedtime. 60 tablet 1  . SYMBICORT 160-4.5 MCG/ACT inhaler Inhale 2 puffs into the lungs 2 (two) times daily.  4  . tiZANidine (ZANAFLEX) 4 MG tablet TAKE ONE (1) TABLET THREE (3) TIMES EACH DAY 90 tablet 1  . Vitamin D, Ergocalciferol, (DRISDOL) 50000 units CAPS capsule Take 1 capsule (50,000 Units total) by mouth every 7 (seven) days. 4 capsule 11   No current facility-administered medications on file prior to visit.  ALLERGIES: Allergies  Allergen Reactions  . Advera [Compleat] Itching    Throat Itching  . Relpax [Eletriptan] Other (See Comments)    Weakness, couldn't move  . Advair Diskus [Fluticasone-Salmeterol]   . Ativan [Lorazepam]   . Citalopram   . Morphine And Related Itching    FAMILY HISTORY: Family History  Problem Relation Age of Onset  . Congestive Heart Failure Mother   . Hypertension Mother   . Alcoholism Father     SOCIAL HISTORY: Social History   Socioeconomic History  . Marital status: Divorced    Spouse name: Not on file  . Number of children: 5  . Years of education: Not on file  . Highest education level: 8th grade  Occupational History  . Occupation: disabled    Associate Professor: OTHER    Comment: n/a  Social Needs  . Financial resource strain: Not on file  . Food insecurity:    Worry: Not on file    Inability: Not on file  . Transportation needs:    Medical: Not on file    Non-medical:  Not on file  Tobacco Use  . Smoking status: Current Every Day Smoker    Packs/day: 0.25    Years: 35.00    Pack years: 8.75    Types: Cigarettes  . Smokeless tobacco: Never Used  . Tobacco comment: 3 Cigarettes daily-01/27/17  Substance and Sexual Activity  . Alcohol use: No  . Drug use: No  . Sexual activity: Yes  Lifestyle  . Physical activity:    Days per week: Not on file    Minutes per session: Not on file  . Stress: Not on file  Relationships  . Social connections:    Talks on phone: Not on file    Gets together: Not on file    Attends religious service: Not on file    Active member of club or organization: Not on file    Attends meetings of clubs or organizations: Not on file    Relationship status: Not on file  . Intimate partner violence:    Fear of current or ex partner: Not on file    Emotionally abused: Not on file    Physically abused: Not on file    Forced sexual activity: Not on file  Other Topics Concern  . Not on file  Social History Narrative   Patient lives at home with family.   Caffeine Use: 1 cup daily   Pt is right handed. She lives with a daughter, her granddaughter and husband. She drinks 1 cup of coffee a day, 3-4 22oz sodas. No exercise.    REVIEW OF SYSTEMS: Constitutional: No fevers, chills, or sweats, no generalized fatigue, change in appetite Eyes: No visual changes, double vision, eye pain Ear, nose and throat: No hearing loss, ear pain, nasal congestion, sore throat Cardiovascular: No chest pain, palpitations Respiratory:  No shortness of breath at rest or with exertion, wheezes GastrointestinaI: No nausea, vomiting, diarrhea, abdominal pain, fecal incontinence Genitourinary:  No dysuria, urinary retention or frequency Musculoskeletal:  Neck pain, back pain Integumentary: No rash, pruritus, skin lesions Neurological: as above Psychiatric: depression, insomnia, anxiety Endocrine: No palpitations, fatigue, diaphoresis, mood swings,  change in appetite, change in weight, increased thirst Hematologic/Lymphatic:  No purpura, petechiae. Allergic/Immunologic: no itchy/runny eyes, nasal congestion, recent allergic reactions, rashes  PHYSICAL EXAM: Vitals:   12/21/17 0912  BP: (!) 146/84  Pulse: 81  SpO2: 94%   General: No acute distress.  Morbidly obese Head:  Normocephalic/atraumatic Eyes:  fundi examined but not visualized Neck: supple, paraspinal tenderness, full range of motion Back: paraspinal tenderness Heart: regular rate and rhythm Lungs: Clear to auscultation bilaterally. Vascular: No carotid bruits. Neurological Exam: Mental status: alert and oriented to person, place, and time, recent and remote memory intact, fund of knowledge intact, attention and concentration intact, speech fluent and not dysarthric, language intact. Cranial nerves: CN I: not tested CN II: pupils equal, round and reactive to light, visual fields intact CN III, IV, VI:  full range of motion, no nystagmus, no ptosis CN V: facial sensation intact CN VII: upper and lower face symmetric CN VIII: hearing intact CN IX, X: gag intact, uvula midline CN XI: sternocleidomastoid and trapezius muscles intact CN XII: tongue midline Bulk & Tone: normal, no fasciculations. Motor:  5/5 throughout  Sensation: temperature and vibration sensation intact. Deep Tendon Reflexes:  trace throughout, toes downgoing. Finger to nose testing:  Without dysmetria.  Heel to shin:  Without dysmetria.  Gait:  Normal station and stride.  Romberg negative.  IMPRESSION: Chronic migraine without aura Morbid obesity Tobacco use disorder Hypertension Bipolar I disorder/depression and anxiety OSA  PLAN: 1.  I recommend Botox.  She has over 30 months of at least 15 headache days per month.  She has failed or has had adverse effects to multiple preventative medications.  She failed topiramate.  She had side effects to nortriptyline and Effexor.  Due to history of  emphysema, I would rather avoid beta blockers.  Due to morbid obesity, I would like to avoid Depakote. 2.  For abortive therapy, She will try naproxen .  Given the significant side effects with Relpax (hallucinations, delusions), I would avoid other triptans. 3.  Advised not to use hydrocodone or OTC medications to treat headache.  Informed to limit use of pain relievers to no more than 2 days out of week to prevent rebound headache.  She is on hydrocodone for other chronic pain, which may complicate treatment of headaches. 4.  Headache diary 5.  Diet/weight loss, smoking cessation. 6.  Continue working with psychiatry to optimize treatment of depression. 7.  Consider Mg, B2, and CoQ10 8.  Follow up with PCP regarding blood pressure 9.  Follow up for Botox  Tobacco cessation counseling (CPT 99406):  Tobacco use with history of CAD  - Currently smoking 3 cigarettes/day   - Patient was informed of the dangers of tobacco abuse including stroke, cancer, and MI, as well as benefits of tobacco cessation. - Patient is not willing to quit at this time. - Approximately 5 mins were spent counseling patient cessation techniques. We discussed various methods to help quit smoking, including deciding on a date to quit, joining a support group, pharmacological agents- nicotine gum/patch/lozenges, chantix.  - I will reassess her progress at the next follow-up visit   Thank you for allowing me to take part in the care of this patient.  Shon Millet, DO  CC:  Prudy Feeler, PA-C

## 2017-12-21 NOTE — Progress Notes (Signed)
Faxed PA to [facility] along with today's consult note

## 2017-12-30 ENCOUNTER — Other Ambulatory Visit: Payer: Self-pay | Admitting: Physician Assistant

## 2017-12-30 DIAGNOSIS — R252 Cramp and spasm: Secondary | ICD-10-CM

## 2018-01-09 LAB — HM DIABETES EYE EXAM

## 2018-01-11 NOTE — Progress Notes (Addendum)
Called [place] [facility], spoke with Charolotte Capuchin apprvd [place] [phone] 12/21/17 - 03/21/18  Called [facility] [phone], spoke with Boneta Lucks, started enrollment, trx to pharmacist Marchelle Folks, gave verbal Rx with r3.  Called Pt gave her [facility] number to call and complete enrollment.  [facility].

## 2018-01-15 NOTE — Pre-Procedure Instructions (Signed)
Shaylin Blatt  01/15/2018      [facility] - [place], [place] - [address] [name] [address] Phone: [phone] Fax: [phone]    Your procedure is scheduled on Jan 21, 2018.  Report to [facility] Admitting at 130 PM.  Call this number if you have problems the morning of surgery:  [phone]   Remember:  No food or drink after midnight.  Continue all medications as directed by your physician except follow these medication instructions before surgery below   Take these medicines the morning of surgery with A SIP OF WATER  Albuterol inhaler-if needed (bring inhaler with you) Symbicort inhaler bring inhaler with you Albuterol nebulizer-if needed Alprazolam (xanax) Cetirizine (zyrtec) Hydrocodone-acetaminophen (norco) lurasidone (latuda) Tizanidine (zanaflex)  7 days prior to surgery STOP taking any Aspirin (unless otherwise instructed by your surgeon), Aleve, Naproxen, Ibuprofen, Motrin, Advil, Goody's, BC's, all herbal medications, fish oil, and all vitamins  WHAT DO I DO ABOUT MY DIABETES MEDICATION?   Marland Kitchen Do not take oral diabetes medicines (pills) the morning of surgery metformin (glucophage).  Reviewed and Endorsed by [facility] Patient Education Committee, August 2015  How to Manage Your Diabetes Before and After Surgery  Why is it important to control my blood sugar before and after surgery? . Improving blood sugar levels before and after surgery helps healing and can limit problems. . A way of improving blood sugar control is eating a healthy diet by: o  Eating less sugar and carbohydrates o  Increasing activity/exercise o  Talking with your doctor about reaching your blood sugar goals . High blood sugars (greater than 180 mg/dL) can raise your risk of infections and slow your recovery, so you will need to focus on controlling your diabetes during the weeks before surgery. . Make sure that the doctor who takes care  of your diabetes knows about your planned surgery including the date and location.  How do I manage my blood sugar before surgery? . Check your blood sugar at least 4 times a day, starting 2 days before surgery, to make sure that the level is not too high or low. o Check your blood sugar the morning of your surgery when you wake up and every 2 hours until you get to the Short Stay unit. . If your blood sugar is less than 70 mg/dL, you will need to treat for low blood sugar: o Do not take insulin. o Treat a low blood sugar (less than 70 mg/dL) with  cup of clear juice (cranberry or apple), 4 glucose tablets, OR glucose gel. Recheck blood sugar in 15 minutes after treatment (to make sure it is greater than 70 mg/dL). If your blood sugar is not greater than 70 mg/dL on recheck, call [phone] o  for further instructions. . Report your blood sugar to the short stay nurse when you get to Short Stay.  . If you are admitted to the hospital after surgery: o Your blood sugar will be checked by the staff and you will probably be given insulin after surgery (instead of oral diabetes medicines) to make sure you have good blood sugar levels. o The goal for blood sugar control after surgery is 80-180 mg/dL.    Do not wear jewelry, make-up or nail polish.  Do not wear lotions, powders, or perfumes, or deodorant.  Do not shave 48 hours prior to surgery.    Do not bring valuables to the hospital.  [facility] is not responsible for any belongings or valuables.  Contacts, dentures or bridgework may not be worn into surgery.  Leave your suitcase in the car.  After surgery it may be brought to your room.  For patients admitted to the hospital, discharge time will be determined by your treatment team.  Patients discharged the day of surgery will not be allowed to drive home.   [place]- Preparing For Surgery  Before surgery, you can play an important role. Because skin is not sterile, your skin  needs to be as free of germs as possible. You can reduce the number of germs on your skin by washing with CHG (chlorahexidine gluconate) Soap before surgery.  CHG is an antiseptic cleaner which kills germs and bonds with the skin to continue killing germs even after washing.    Oral Hygiene is also important to reduce your risk of infection.  Remember - BRUSH YOUR TEETH THE MORNING OF SURGERY WITH YOUR REGULAR TOOTHPASTE  Please do not use if you have an allergy to CHG or antibacterial soaps. If your skin becomes reddened/irritated stop using the CHG.  Do not shave (including legs and underarms) for at least 48 hours prior to first CHG shower. It is OK to shave your face.  Please follow these instructions carefully.   1. Shower the NIGHT BEFORE SURGERY and the MORNING OF SURGERY with CHG.   2. If you chose to wash your hair, wash your hair first as usual with your normal shampoo.  3. After you shampoo, rinse your hair and body thoroughly to remove the shampoo.  4. Use CHG as you would any other liquid soap. You can apply CHG directly to the skin and wash gently with a scrungie or a clean washcloth.   5. Apply the CHG Soap to your body ONLY FROM THE NECK DOWN.  Do not use on open wounds or open sores. Avoid contact with your eyes, ears, mouth and genitals (private parts). Wash Face and genitals (private parts)  with your normal soap.  6. Wash thoroughly, paying special attention to the area where your surgery will be performed.  7. Thoroughly rinse your body with warm water from the neck down.  8. DO NOT shower/wash with your normal soap after using and rinsing off the CHG Soap.  9. Pat yourself dry with a CLEAN TOWEL.  10. Wear CLEAN PAJAMAS to bed the night before surgery, wear comfortable clothes the morning of surgery  11. Place CLEAN SHEETS on your bed the night of your first shower and DO NOT SLEEP WITH PETS.  Day of Surgery:  Do not apply any deodorants/lotions.  Please wear  clean clothes to the hospital/surgery center.   Remember to brush your teeth WITH YOUR REGULAR TOOTHPASTE.   Please read over the following fact sheets that you were given. Pain Booklet, Coughing and Deep Breathing and Surgical Site Infection Prevention

## 2018-01-15 NOTE — Progress Notes (Addendum)
Called and spoke with patient (hipaa verified name/dob).  Patient reports she is cancelling her surgery and will not be coming in to her pre-op appointment. I advised her to call Dr. Glenna Durand office to inform them ASAP and she verbalized understanding.

## 2018-01-19 ENCOUNTER — Inpatient Hospital Stay (HOSPITAL_COMMUNITY)
Admission: RE | Admit: 2018-01-19 | Discharge: 2018-01-19 | Disposition: A | Discharge status: Home / Self Care | Payer: Medicaid Other | Source: Ambulatory Visit

## 2018-01-19 NOTE — Progress Notes (Signed)
Haven't rcvd botox, called Pt to confirm she had completed her enrollment. LMOVM for her to call me back, also left contact number for [facility].

## 2018-01-21 ENCOUNTER — Ambulatory Visit (HOSPITAL_COMMUNITY): Admission: RE | Admit: 2018-01-21 | Payer: Medicaid Other | Source: Ambulatory Visit | Admitting: Orthopedic Surgery

## 2018-01-21 ENCOUNTER — Encounter (HOSPITAL_COMMUNITY): Admission: RE | Payer: Self-pay | Source: Ambulatory Visit

## 2018-01-21 SURGERY — CARPAL TUNNEL RELEASE
Anesthesia: LOCAL | Laterality: Left

## 2018-01-22 ENCOUNTER — Ambulatory Visit: Payer: Medicaid Other | Admitting: Neurology

## 2018-01-25 ENCOUNTER — Other Ambulatory Visit: Payer: Self-pay | Admitting: Physician Assistant

## 2018-01-25 DIAGNOSIS — G8929 Other chronic pain: Secondary | ICD-10-CM

## 2018-01-25 DIAGNOSIS — M5136 Other intervertebral disc degeneration, lumbar region: Secondary | ICD-10-CM

## 2018-01-25 DIAGNOSIS — M25511 Pain in right shoulder: Principal | ICD-10-CM

## 2018-01-27 ENCOUNTER — Ambulatory Visit: Payer: Medicaid Other | Admitting: Physician Assistant

## 2018-01-29 ENCOUNTER — Encounter: Payer: Self-pay | Admitting: Physician Assistant

## 2018-01-29 ENCOUNTER — Ambulatory Visit: Payer: Medicaid Other | Admitting: Physician Assistant

## 2018-01-29 DIAGNOSIS — M5136 Other intervertebral disc degeneration, lumbar region: Secondary | ICD-10-CM | POA: Diagnosis not present

## 2018-01-29 DIAGNOSIS — M25511 Pain in right shoulder: Secondary | ICD-10-CM

## 2018-01-29 DIAGNOSIS — G8929 Other chronic pain: Secondary | ICD-10-CM | POA: Diagnosis not present

## 2018-01-29 DIAGNOSIS — R252 Cramp and spasm: Secondary | ICD-10-CM

## 2018-01-29 MED ORDER — DULOXETINE HCL 30 MG PO CPEP: 30.0000 mg | ORAL_CAPSULE | Freq: Every day | ORAL | 3 refills | Status: DC

## 2018-01-29 MED ORDER — ROPINIROLE HCL 2 MG PO TABS
2.0000 mg | ORAL_TABLET | Freq: Two times a day (BID) | ORAL | 5 refills | Status: DC
Start: 2018-01-29 — End: 2018-02-12

## 2018-01-29 MED ORDER — HYDROCODONE-ACETAMINOPHEN 10-325 MG PO TABS: 1.0000 | ORAL_TABLET | Freq: Four times a day (QID) | ORAL | 0 refills | Status: DC | PRN

## 2018-01-29 MED ORDER — BUMETANIDE 1 MG PO TABS: 1.0000 mg | ORAL_TABLET | Freq: Every day | ORAL | 2 refills | Status: DC

## 2018-01-29 MED ORDER — HYDROCODONE-ACETAMINOPHEN 10-325 MG PO TABS: 1.0000 | ORAL_TABLET | ORAL | 0 refills | Status: AC | PRN

## 2018-02-01 NOTE — Progress Notes (Signed)
   BP 139/86   Pulse 90   Temp 98.2 F (36.8 C) (Oral)   Ht 5' 6" (1.676 m)   Wt (!) 373 lb 6.4 oz (169.4 kg)   BMI 60.27 kg/m     Subjective:    Patient ID: Marissa [name], female    DOB: 11/23/1965, 51 y.o.   MRN: [id number]  HPI: Marissa [name] is a 51 y.o. female presenting on 01/29/2018 for all over cramping  Patient comes in for recheck on her chronic medical conditions.  His multiple neurologic and musculoskeletal complaints including degenerative disc of the lumbar spine and she has cramping and spasm associated with the nerve damage.  She also has chronic right shoulder pain.  She has been seeing neurology for her migraine headaches.  They have discussed the ability of using Botox as something to prevent them and she is to call them if they flareup again and have severe amount and severity.  She states that this time her headaches are fairly stable.  Most bothersome is a cramping and spasm.  It will happen significantly at night.  She is on a restless leg medicine but is not exhausted the full dosing.  We have discussed increasing the.  All of her medications are reviewed and refilled as needed.  Past Medical History:  Diagnosis Date  . Arthritis   . Asthma   . Atelectasis pulmonary   . Back pain   . CHF (congestive heart failure) (HCC)    11/2016  . Chronic back pain   . COPD (chronic obstructive pulmonary disease) (HCC)   . Depression   . Depression with anxiety   . Dyspnea   . Emphysema lung (HCC)   . Emphysema lung (HCC)   . Fibromyalgia   . GERD (gastroesophageal reflux disease)   . Headache(784.0)    HX MIGRAINES  . Hypercholesterolemia   . Hypertension   . Insomnia   . Morbid obesity (HCC)   . Pneumonia    MARCH   2018  . Requires supplemental oxygen    3 LITERS AT HS  . Sleep apnea    BIPAP +  O2   Relevant past medical, surgical, family and social history reviewed and updated as indicated. Interim medical history since our last visit reviewed. Allergies  and medications reviewed and updated. DATA REVIEWED: CHART IN EPIC  Family History reviewed for pertinent findings.  Review of Systems  Constitutional: Positive for fatigue. Negative for activity change, chills and fever.  HENT: Negative.   Eyes: Negative.   Respiratory: Positive for shortness of breath. Negative for cough.   Cardiovascular: Negative.  Negative for chest pain.  Gastrointestinal: Negative.  Negative for abdominal pain.  Endocrine: Negative.   Genitourinary: Negative.  Negative for dysuria.  Musculoskeletal: Positive for arthralgias, back pain and myalgias.  Skin: Negative.   Neurological: Positive for tremors and weakness.    Allergies as of 01/29/2018      Reactions   Advera [compleat] Itching   Throat Itching   Relpax [eletriptan] Other (See Comments)   Weakness, couldn't move   Advair Diskus [fluticasone-salmeterol]    Ativan [lorazepam]    Citalopram    Morphine And Related Itching      Medication List        Accurate as of 01/29/18 11:59 PM. Always use your most recent med list.          albuterol (2.5 MG/3ML) 0.083% nebulizer solution Commonly known as:  PROVENTIL Take 2.5 mg by nebulization every   6 (six) hours as needed for wheezing or shortness of breath.   albuterol 108 (90 Base) MCG/ACT inhaler Commonly known as:  PROVENTIL HFA;VENTOLIN HFA Inhale 2 puffs into the lungs every 4 (four) hours as needed for wheezing or shortness of breath.   ALPRAZolam 1 MG tablet Commonly known as:  XANAX Take 1 mg by mouth 3 (three) times daily.   bumetanide 1 MG tablet Commonly known as:  BUMEX Take 1 tablet (1 mg total) by mouth daily.   DULoxetine 30 MG capsule Commonly known as:  CYMBALTA Take 1-2 capsules (30-60 mg total) by mouth daily.   HYDROcodone-acetaminophen 10-325 MG tablet Commonly known as:  NORCO Take 1 tablet by mouth every 4 (four) hours as needed.   HYDROcodone-acetaminophen 10-325 MG tablet Commonly known as:  NORCO Take 1  tablet by mouth every 6 (six) hours as needed.   HYDROcodone-acetaminophen 10-325 MG tablet Commonly known as:  NORCO Take 1 tablet by mouth every 6 (six) hours as needed.   ibuprofen 200 MG tablet Commonly known as:  ADVIL,MOTRIN Take 800 mg by mouth every 8 (eight) hours as needed for mild pain or moderate pain.   LATUDA 60 MG Tabs Generic drug:  Lurasidone HCl Take 60 mg daily by mouth.   multivitamin with minerals tablet Take 1 tablet by mouth daily.   naproxen sodium 550 MG tablet Commonly known as:  ANAPROX DS Take 1 tablet (550 mg total) by mouth every 12 (twelve) hours as needed.   NON FORMULARY Pt uses CPAP machine at bedtime   OXYGEN Inhale 3 L into the lungs at bedtime.   Potassium 99 MG Tabs Take 1 tablet by mouth every other day.   rOPINIRole 2 MG tablet Commonly known as:  REQUIP Take 1 tablet (2 mg total) by mouth 2 (two) times daily.   SYMBICORT 160-4.5 MCG/ACT inhaler Generic drug:  budesonide-formoterol Inhale 2 puffs into the lungs 2 (two) times daily.   Vitamin D (Ergocalciferol) 50000 units Caps capsule Commonly known as:  DRISDOL Take 1 capsule (50,000 Units total) by mouth every 7 (seven) days.   ZYRTEC ALLERGY 10 MG Caps Generic drug:  Cetirizine HCl Take 10 mg by mouth daily.          Objective:    BP 139/86   Pulse 90   Temp 98.2 F (36.8 C) (Oral)   Ht 5' 6" (1.676 m)   Wt (!) 373 lb 6.4 oz (169.4 kg)   BMI 60.27 kg/m    Allergies  Allergen Reactions  . Advera [Compleat] Itching    Throat Itching  . Relpax [Eletriptan] Other (See Comments)    Weakness, couldn't move  . Advair Diskus [Fluticasone-Salmeterol]   . Ativan [Lorazepam]   . Citalopram   . Morphine And Related Itching    Wt Readings from Last 3 Encounters:  01/29/18 (!) 373 lb 6.4 oz (169.4 kg)  12/21/17 (!) 364 lb (165.1 kg)  11/04/17 (!) 357 lb 9.6 oz (162.2 kg)    Physical Exam  Constitutional: She is oriented to person, place, and time. She appears  well-developed and well-nourished.  HENT:  Head: Normocephalic and atraumatic.  Eyes: Pupils are equal, round, and reactive to light. Conjunctivae and EOM are normal.  Cardiovascular: Normal rate, regular rhythm, normal heart sounds and intact distal pulses.  Pulmonary/Chest: Effort normal and breath sounds normal.  Abdominal: Soft. Bowel sounds are normal.  Neurological: She is alert and oriented to person, place, and time. She has normal reflexes.  Skin:  Skin is warm and dry. No rash noted.  Psychiatric: She has a normal mood and affect. Her behavior is normal. Judgment and thought content normal.    Results for orders placed or performed in visit on 09/15/17  Magnesium  Result Value Ref Range   Magnesium 1.8 1.6 - 2.3 mg/dL  CMP14+EGFR  Result Value Ref Range   Glucose 157 (H) 65 - 99 mg/dL   BUN 12 6 - 24 mg/dL   Creatinine, Ser 0.68 0.57 - 1.00 mg/dL   GFR calc non Af Amer 102 >59 mL/min/1.73   GFR calc Af Amer 118 >59 mL/min/1.73   BUN/Creatinine Ratio 18 9 - 23   Sodium 137 134 - 144 mmol/L   Potassium 4.3 3.5 - 5.2 mmol/L   Chloride 98 96 - 106 mmol/L   CO2 22 20 - 29 mmol/L   Calcium 9.2 8.7 - 10.2 mg/dL   Total Protein 7.0 6.0 - 8.5 g/dL   Albumin 4.2 3.5 - 5.5 g/dL   Globulin, Total 2.8 1.5 - 4.5 g/dL   Albumin/Globulin Ratio 1.5 1.2 - 2.2   Bilirubin Total 0.4 0.0 - 1.2 mg/dL   Alkaline Phosphatase 89 39 - 117 IU/L   AST 17 0 - 40 IU/L   ALT 19 0 - 32 IU/L  Rocky mtn spotted fvr abs pnl(IgG+IgM)  Result Value Ref Range   RMSF IgG Negative Negative   RMSF IgM 0.23 0.00 - 0.89 index  Thyroid Panel With TSH  Result Value Ref Range   TSH 3.200 0.450 - 4.500 uIU/mL   T4, Total 6.8 4.5 - 12.0 ug/dL   T3 Uptake Ratio 24 24 - 39 %   Free Thyroxine Index 1.6 1.2 - 4.9  Vitamin B12  Result Value Ref Range   Vitamin B-12 274 232 - 1,245 pg/mL  VITAMIN D 25 Hydroxy (Vit-D Deficiency, Fractures)  Result Value Ref Range   Vit D, 25-Hydroxy 20.7 (L) 30.0 - 100.0 ng/mL   Folate  Result Value Ref Range   Folate 8.7 >3.0 ng/mL  Arthritis Panel  Result Value Ref Range   Uric Acid 5.4 2.5 - 7.1 mg/dL   Rhuematoid fact SerPl-aCnc <10.0 0.0 - 13.9 IU/mL   WBC 13.0 (H) 3.4 - 10.8 x10E3/uL   RBC 4.66 3.77 - 5.28 x10E6/uL   Hemoglobin 15.6 11.1 - 15.9 g/dL   Hematocrit 46.0 34.0 - 46.6 %   MCV 99 (H) 79 - 97 fL   MCH 33.5 (H) 26.6 - 33.0 pg   MCHC 33.9 31.5 - 35.7 g/dL   RDW 13.0 12.3 - 15.4 %   Platelets 244 150 - 379 x10E3/uL   Neutrophils 67 Not Estab. %   Lymphs 25 Not Estab. %   Monocytes 7 Not Estab. %   Eos 1 Not Estab. %   Basos 0 Not Estab. %   Neutrophils Absolute 8.6 (H) 1.4 - 7.0 x10E3/uL   Lymphocytes Absolute 3.2 (H) 0.7 - 3.1 x10E3/uL   Monocytes Absolute 0.9 0.1 - 0.9 x10E3/uL   EOS (ABSOLUTE) 0.1 0.0 - 0.4 x10E3/uL   Basophils Absolute 0.1 0.0 - 0.2 x10E3/uL   Immature Granulocytes 0 Not Estab. %   Immature Grans (Abs) 0.0 0.0 - 0.1 x10E3/uL   Sed Rate 38 0 - 40 mm/hr  Protime-INR  Result Value Ref Range   INR 1.0 0.8 - 1.2   Prothrombin Time 10.2 9.1 - 12.0 sec      Assessment & Plan:   1. Cramp and spasm - rOPINIRole (  REQUIP) 2 MG tablet; Take 1 tablet (2 mg total) by mouth 2 (two) times daily.  Dispense: 60 tablet; Refill: 5  2. Chronic right shoulder pain - HYDROcodone-acetaminophen (NORCO) 10-325 MG tablet; Take 1 tablet by mouth every 4 (four) hours as needed.  Dispense: 120 tablet; Refill: 0  3. Lumbar degenerative disc disease - HYDROcodone-acetaminophen (NORCO) 10-325 MG tablet; Take 1 tablet by mouth every 4 (four) hours as needed.  Dispense: 120 tablet; Refill: 0   Continue all other maintenance medications as listed above.  Follow up plan: Return in about 3 months (around 05/01/2018).  Educational handout given for survey  Angel S. Jones PA-C [facility] [address]  [place], [place] [address] [phone]   02/01/2018, 12:53 PM   

## 2018-02-09 ENCOUNTER — Ambulatory Visit: Payer: Medicaid Other | Admitting: Physician Assistant

## 2018-02-12 ENCOUNTER — Other Ambulatory Visit: Payer: Self-pay | Admitting: Physician Assistant

## 2018-02-12 ENCOUNTER — Telehealth: Payer: Self-pay | Admitting: Physician Assistant

## 2018-02-12 DIAGNOSIS — R252 Cramp and spasm: Secondary | ICD-10-CM

## 2018-02-12 MED ORDER — ROPINIROLE HCL 2 MG PO TABS: 2.0000 mg | ORAL_TABLET | Freq: Two times a day (BID) | ORAL | 5 refills | Status: DC

## 2018-02-12 NOTE — Telephone Encounter (Signed)
There is not really anything else like it, can reduce it back to the old dose

## 2018-02-12 NOTE — Telephone Encounter (Signed)
Patient aware.

## 2018-02-12 NOTE — Telephone Encounter (Signed)
Sent refill

## 2018-02-12 NOTE — Telephone Encounter (Signed)
Patient aware- states she has been using the Requip and it makes her cramps worse. Would like it changed to something else.

## 2018-03-02 ENCOUNTER — Encounter: Payer: Self-pay | Admitting: Family Medicine

## 2018-03-02 ENCOUNTER — Ambulatory Visit: Payer: Medicaid Other | Admitting: Family Medicine

## 2018-03-02 VITALS — BP 123/71 | HR 65 | Temp 97.4°F | Ht 66.0 in | Wt 386.0 lb

## 2018-03-02 DIAGNOSIS — R233 Spontaneous ecchymoses: Secondary | ICD-10-CM

## 2018-03-02 DIAGNOSIS — R238 Other skin changes: Secondary | ICD-10-CM

## 2018-03-02 DIAGNOSIS — R600 Localized edema: Secondary | ICD-10-CM

## 2018-03-02 LAB — BAYER DCA HB A1C WAIVED: HB A1C (BAYER DCA - WAIVED): 6.1 % (ref <7.0)

## 2018-03-02 NOTE — Progress Notes (Signed)
Subjective: CC: LE edema PCP: Terald Sleeper, PA-C SEL:TRVUYE Marissa [name] is a 52 y.o. female presenting to clinic today for:  1. LE edema Patient reports a long-standing history of bilateral lower extremity edema.  She notes that the lower extremity edema seems to be worse since October 2018.  She was actually hospitalized with shortness of breath and severe lower extremity edema in March of last year.  PMH denotes CHF however patient had echo at [facility] in 10/2016 which revealed LVEF 33-43%, normal systolic and diastolic function, normal RV and no significant valvular abnormalities.  She reports that her PCP has addressed lower extremity edema in the past and actually switched her from Lasix to Bumex recently.  She notes that she used Bumex every other day for a while near the time of her menstrual cycle but noticed no significant improvement in fluid.  She notes that this is when her fluid seems to be most elevated.  Denies any worsening shortness of breath.  She does try to keep legs elevated when she is seated but notes that it is rarely ever above waist level.  She often sits on her porch with her legs down.  She does not prepare her foods.  She notes that a family member often prepares her meals but she never adds salt to the foods.  When further questioned, she does admit that much of the food is prepackaged or comes out of the freezer.  Often, she will drink 1-2 sodas per day.  Additionally, she notes that she has easy bruising and often peels after her legs have become very swollen.  2.  Morbid obesity Patient reports that she has been obese all of her life.  She is "tried everything" in efforts to lose weight.  She is interested in seeing a bariatric surgeon for consideration of gastric bypass.  Diet as above.  Drinks sodas, eats what is prepared for her.    ROS: Per HPI  Allergies  Allergen Reactions  . Advera [Compleat] Itching    Throat Itching  . Relpax [Eletriptan] Other (See  Comments)    Weakness, couldn't move  . Advair Diskus [Fluticasone-Salmeterol]   . Ativan [Lorazepam]   . Citalopram   . Morphine And Related Itching   Past Medical History:  Diagnosis Date  . Arthritis   . Asthma   . Atelectasis pulmonary   . Back pain   . CHF (congestive heart failure) ([place])    11/2016  . Chronic back pain   . COPD (chronic obstructive pulmonary disease) ([place])   . Depression   . Depression with anxiety   . Dyspnea   . Emphysema lung ([place])   . Emphysema lung ([place])   . Fibromyalgia   . GERD (gastroesophageal reflux disease)   . Headache(784.0)    HX MIGRAINES  . Hypercholesterolemia   . Hypertension   . Insomnia   . Morbid obesity ([place])   . Pneumonia    MARCH   2018  . Requires supplemental oxygen    3 LITERS AT HS  . Sleep apnea    BIPAP +  O2    Current Outpatient Medications:  .  albuterol (PROVENTIL HFA;VENTOLIN HFA) 108 (90 BASE) MCG/ACT inhaler, Inhale 2 puffs into the lungs every 4 (four) hours as needed for wheezing or shortness of breath., Disp: 1 Inhaler, Rfl: 2 .  albuterol (PROVENTIL) (2.5 MG/3ML) 0.083% nebulizer solution, Take 2.5 mg by nebulization every 6 (six) hours as needed for wheezing or shortness of breath.,  Disp: , Rfl:  .  ALPRAZolam (XANAX) 1 MG tablet, Take 1 mg by mouth 3 (three) times daily. , Disp: , Rfl:  .  Cetirizine HCl (ZYRTEC ALLERGY) 10 MG CAPS, Take 10 mg by mouth daily. , Disp: , Rfl:  .  DULoxetine (CYMBALTA) 30 MG capsule, Take 1-2 capsules (30-60 mg total) by mouth daily., Disp: 60 capsule, Rfl: 3 .  HYDROcodone-acetaminophen (NORCO) 10-325 MG tablet, Take 1 tablet by mouth every 6 (six) hours as needed., Disp: 120 tablet, Rfl: 0 .  HYDROcodone-acetaminophen (NORCO) 10-325 MG tablet, Take 1 tablet by mouth every 6 (six) hours as needed., Disp: 120 tablet, Rfl: 0 .  ibuprofen (ADVIL,MOTRIN) 200 MG tablet, Take 800 mg by mouth every 8 (eight) hours as needed for mild pain or moderate pain., Disp: , Rfl:  .   Lurasidone HCl (LATUDA) 60 MG TABS, Take 60 mg daily by mouth., Disp: , Rfl:  .  Multiple Vitamins-Minerals (MULTIVITAMIN WITH MINERALS) tablet, Take 1 tablet by mouth daily., Disp: , Rfl:  .  naproxen sodium (ANAPROX DS) 550 MG tablet, Take 1 tablet (550 mg total) by mouth every 12 (twelve) hours as needed., Disp: 16 tablet, Rfl: 3 .  NON FORMULARY, Pt uses CPAP machine at bedtime, Disp: , Rfl:  .  OXYGEN, Inhale 3 L into the lungs at bedtime., Disp: , Rfl:  .  Potassium 99 MG TABS, Take 1 tablet by mouth every other day., Disp: , Rfl:  .  rOPINIRole (REQUIP) 2 MG tablet, Take 1 tablet (2 mg total) by mouth 2 (two) times daily., Disp: 120 tablet, Rfl: 5 .  SYMBICORT 160-4.5 MCG/ACT inhaler, Inhale 2 puffs into the lungs 2 (two) times daily., Disp: , Rfl: 4 .  Vitamin D, Ergocalciferol, (DRISDOL) 50000 units CAPS capsule, Take 1 capsule (50,000 Units total) by mouth every 7 (seven) days., Disp: 4 capsule, Rfl: 11 Social History   Socioeconomic History  . Marital status: Divorced    Spouse name: Not on file  . Number of children: 5  . Years of education: Not on file  . Highest education level: 8th grade  Occupational History  . Occupation: disabled    Fish farm manager: OTHER    Comment: n/a  Social Needs  . Financial resource strain: Not on file  . Food insecurity:    Worry: Not on file    Inability: Not on file  . Transportation needs:    Medical: Not on file    Non-medical: Not on file  Tobacco Use  . Smoking status: Current Every Day Smoker    Packs/day: 0.25    Years: 35.00    Pack years: 8.75    Types: Cigarettes  . Smokeless tobacco: Never Used  . Tobacco comment: 3 Cigarettes daily-01/27/17  Substance and Sexual Activity  . Alcohol use: No  . Drug use: No  . Sexual activity: Yes  Lifestyle  . Physical activity:    Days per week: Not on file    Minutes per session: Not on file  . Stress: Not on file  Relationships  . Social connections:    Talks on phone: Not on file     Gets together: Not on file    Attends religious service: Not on file    Active member of club or organization: Not on file    Attends meetings of clubs or organizations: Not on file    Relationship status: Not on file  . Intimate partner violence:    Fear of current or ex  partner: Not on file    Emotionally abused: Not on file    Physically abused: Not on file    Forced sexual activity: Not on file  Other Topics Concern  . Not on file  Social History Narrative   Patient lives at home with family.   Caffeine Use: 1 cup daily   Pt is right handed. She lives with a daughter, her granddaughter and husband. She drinks 1 cup of coffee a day, 3-4 22oz sodas. No exercise.   Family History  Problem Relation Age of Onset  . Congestive Heart Failure Mother   . Hypertension Mother   . Alcoholism Father     Objective: Office vital signs reviewed. BP 123/71   Pulse 65   Temp (!) 97.4 F (36.3 C) (Oral)   Ht '5\' 6"'$  (1.676 m)   Wt (!) 386 lb (175.1 kg)   BMI 62.30 kg/m   Physical Examination:  General: Awake, alert, morbidly obese, No acute distress HEENT: Normal    Neck: No masses palpated. No lymphadenopathy; no JVD. No carotid bruits Cardio: regular rate and rhythm, S1S2 heard, no murmurs appreciated Pulm: clear to auscultation bilaterally, no wheezes, rhonchi or rales; normal work of breathing on room air Extremities: warm, well perfused, +1 pitting edema to lower shins. No cyanosis or clubbing; +2 pulses bilaterally Skin: dry; intact; some bruising appreciated along the medial aspect of the lower extremity.  Assessment/ Plan: 52 y.o. female   1. Bilateral lower extremity edema Likely secondary to morbid obesity and excessive salt intake.  We discussed at length that reduction in salt is essential to improving lower extremity edema.  I encouraged increased physical activity and elevation of lower extremities at heart level when not active.  I have prescribed her compression hose  and given her written Rx to provide to the pharmacy.  We will check CMP, CBC, BMP and TSH to rule out metabolic etiology.  Her past medical history denotes CHF but her echocardiogram and previous cardiology note does not suggest that she has a true CHF diagnosis.  I do not think that she is in a heart failure exacerbation as she did not demonstrate fluid overload other than lower extremity edema.  She has had about a 13 pound weight gain since her visit in June.  I will contact her with the results of the labs once they are available.  I did encourage her to resume use of Bumex over the next couple of days to see if this would help with fluid retention.  She will need to follow-up with her PCP at minimum in 3 weeks for recheck.  Ideally, would benefit from recheck on Monday if she is planning on using Bumex regularly. - CMP14+EGFR - CBC with Differential - Brain natriuretic peptide - TSH  2. Morbid obesity ([place]) Request referral to bariatric surgery.  This is been placed.  I also offered her referral to nutrition. - Bayer DCA Hb A1c Waived - Amb Referral to Bariatric Surgery  3. Easy bruising We will check CBC and PT/INR. - CBC with Differential - Protime-INR   Orders Placed This Encounter  Procedures  . CMP14+EGFR  . CBC with Differential  . Brain natriuretic peptide  . TSH  . Protime-INR  . Bayer DCA Hb A1c Waived  . Amb Referral to Bariatric Surgery    Referral Priority:   Routine    Referral Type:   Consultation    Number of Visits Requested:   [place]  [place], [place] [phone]

## 2018-03-02 NOTE — Patient Instructions (Addendum)
We discussed that it is imperative you reduce your salt intake in efforts to improve your swelling of your legs.  I have prescribed you compression hose.  Use these daily.  Elevate the legs when you are not mobile.  Physical activity will most certainly improve your swelling.  Be as active as possible.  Use the [facility] you for fluid.  I have referred you to Dr. Alvan DameKensinger in [place] to talk about bariatric surgery.  If you are unable to get an appointment soon, and want to see a nutritionist, call the office and we can place a referral for you.  Follow[facility] in the next 3 weeks for recheck.  You had labs performed today.  You will be contacted with the results of the labs once they are available, usually in the next 3 business days for routine lab work.    DASH Eating Plan DASH stands for "Dietary Approaches to Stop Hypertension." The DASH eating plan is a healthy eating plan that has been shown to reduce high blood pressure (hypertension). It may also reduce your risk for type 2 diabetes, heart disease, and stroke. The DASH eating plan may also help with weight loss. What are tips for following this plan? General guidelines  Avoid eating more than 2,300 mg (milligrams) of salt (sodium) a day. If you have hypertension, you may need to reduce your sodium intake to 1,500 mg a day.  Limit alcohol intake to no more than 1 drink a day for nonpregnant women and 2 drinks a day for men. One drink equals 12 oz of beer, 5 oz of wine, or 1 oz of hard liquor.  Work with your health care provider to maintain a healthy body weight or to lose weight. Ask what an ideal weight is for you.  Get at least 30 minutes of exercise that causes your heart to beat faster (aerobic exercise) most days of the week. Activities may include walking, swimming, or biking.  Work with your health care provider or diet and nutrition specialist (dietitian) to adjust your eating plan to your individual  calorie needs. Reading food labels  Check food labels for the amount of sodium per serving. Choose foods with less than 5 percent of the Daily Value of sodium. Generally, foods with less than 300 mg of sodium per serving fit into this eating plan.  To find whole grains, look for the word "whole" as the first word in the ingredient list. Shopping  Buy products labeled as "low-sodium" or "no salt added."  Buy fresh foods. Avoid canned foods and premade or frozen meals. Cooking  Avoid adding salt when cooking. Use salt-free seasonings or herbs instead of table salt or sea salt. Check with your health care provider or pharmacist before using salt substitutes.  Do not fry foods. Cook foods using healthy methods such as baking, boiling, grilling, and broiling instead.  Cook with heart-healthy oils, such as olive, canola, soybean, or sunflower oil. Meal planning   Eat a balanced diet that includes: ? 5 or more servings of fruits and vegetables each day. At each meal, try to fill half of your plate with fruits and vegetables. ? Up to 6-8 servings of whole grains each day. ? Less than 6 oz of lean meat, poultry, or fish each day. A 3-oz serving of meat is about the same size as a deck of cards. One egg equals 1 oz. ? 2 servings of low-fat dairy each day. ? A serving of nuts,  seeds, or beans 5 times each week. ? Heart-healthy fats. Healthy fats called Omega-3 fatty acids are found in foods such as flaxseeds and coldwater fish, like sardines, salmon, and mackerel.  Limit how much you eat of the following: ? Canned or prepackaged foods. ? Food that is high in trans fat, such as fried foods. ? Food that is high in saturated fat, such as fatty meat. ? Sweets, desserts, sugary drinks, and other foods with added sugar. ? Full-fat dairy products.  Do not salt foods before eating.  Try to eat at least 2 vegetarian meals each week.  Eat more home-cooked food and less restaurant, buffet, and fast  food.  When eating at a restaurant, ask that your food be prepared with less salt or no salt, if possible. What foods are recommended? The items listed may not be a complete list. Talk with your dietitian about what dietary choices are best for you. Grains Whole-grain or whole-wheat bread. Whole-grain or whole-wheat pasta. Brown rice. [place]. Bulgur. Whole-grain and low-sodium cereals. Pita bread. Low-fat, low-sodium crackers. Whole-wheat flour tortillas. Vegetables Fresh or frozen vegetables (raw, steamed, roasted, or grilled). Low-sodium or reduced-sodium tomato and vegetable juice. Low-sodium or reduced-sodium tomato sauce and tomato paste. Low-sodium or reduced-sodium canned vegetables. Fruits All fresh, dried, or frozen fruit. Canned fruit in natural juice (without added sugar). Meat and other protein foods Skinless chicken or [place]. Ground chicken or [place]. Pork with fat trimmed off. Fish and seafood. Egg whites. Dried beans, peas, or lentils. Unsalted nuts, nut butters, and seeds. Unsalted canned beans. Lean cuts of beef with fat trimmed off. Low-sodium, lean deli meat. Dairy Low-fat (1%) or fat-free (skim) milk. Fat-free, low-fat, or reduced-fat cheeses. Nonfat, low-sodium ricotta or cottage cheese. Low-fat or nonfat yogurt. Low-fat, low-sodium cheese. Fats and oils Soft margarine without trans fats. Vegetable oil. Low-fat, reduced-fat, or light mayonnaise and salad dressings (reduced-sodium). Canola, safflower, olive, soybean, and sunflower oils. Avocado. Seasoning and other foods Herbs. Spices. Seasoning mixes without salt. Unsalted popcorn and pretzels. Fat-free sweets. What foods are not recommended? The items listed may not be a complete list. Talk with your dietitian about what dietary choices are best for you. Grains Baked goods made with fat, such as croissants, muffins, or some breads. Dry pasta or rice meal packs. Vegetables Creamed or fried vegetables. Vegetables  in a cheese sauce. Regular canned vegetables (not low-sodium or reduced-sodium). Regular canned tomato sauce and paste (not low-sodium or reduced-sodium). Regular tomato and vegetable juice (not low-sodium or reduced-sodium). Angie Fava. Olives. Fruits Canned fruit in a light or heavy syrup. Fried fruit. Fruit in cream or butter sauce. Meat and other protein foods Fatty cuts of meat. Ribs. Fried meat. Berniece Salines. Sausage. Bologna and other processed lunch meats. Salami. Fatback. Hotdogs. Bratwurst. Salted nuts and seeds. Canned beans with added salt. Canned or smoked fish. Whole eggs or egg yolks. Chicken or [place] with skin. Dairy Whole or 2% milk, cream, and half-and-half. Whole or full-fat cream cheese. Whole-fat or sweetened yogurt. Full-fat cheese. Nondairy creamers. Whipped toppings. Processed cheese and cheese spreads. Fats and oils Butter. Stick margarine. Lard. Shortening. Ghee. Bacon fat. Tropical oils, such as coconut, palm kernel, or palm oil. Seasoning and other foods Salted popcorn and pretzels. Onion salt, garlic salt, seasoned salt, table salt, and sea salt. Worcestershire sauce. Tartar sauce. Barbecue sauce. Teriyaki sauce. Soy sauce, including reduced-sodium. Steak sauce. Canned and packaged gravies. Fish sauce. Oyster sauce. Cocktail sauce. Horseradish that you find on the shelf. Ketchup. Mustard. Meat flavorings and  tenderizers. Bouillon cubes. Hot sauce and Tabasco sauce. Premade or packaged marinades. Premade or packaged taco seasonings. Relishes. Regular salad dressings. Where to find more information:  National Heart, Lung, and Blood Institute: PopSteam.is  American Heart Association: www.heart.org Summary  The DASH eating plan is a healthy eating plan that has been shown to reduce high blood pressure (hypertension). It may also reduce your risk for type 2 diabetes, heart disease, and stroke.  With the DASH eating plan, you should limit salt (sodium) intake to 2,300 mg a  day. If you have hypertension, you may need to reduce your sodium intake to 1,500 mg a day.  When on the DASH eating plan, aim to eat more fresh fruits and vegetables, whole grains, lean proteins, low-fat dairy, and heart-healthy fats.  Work with your health care provider or diet and nutrition specialist (dietitian) to adjust your eating plan to your individual calorie needs. This information is not intended to replace advice given to you by your health care provider. Make sure you discuss any questions you have with your health care provider. Document Released: 07/31/2011 Document Revised: 08/04/2016 Document Reviewed: 08/04/2016 Elsevier Interactive Patient Education  2018 [employer].  Edema Edema is an abnormal buildup of fluids in your bodytissues. Edema is somewhatdependent on gravity to pull the fluid to the lowest place in your body. That makes the condition more common in the legs and thighs (lower extremities). Painless swelling of the feet and ankles is common and becomes more likely as you get older. It is also common in looser tissues, like around your eyes. When the affected area is squeezed, the fluid may move out of that spot and leave a dent for a few moments. This dent is called pitting. What are the causes? There are many possible causes of edema. Eating too much salt and being on your feet or sitting for a long time can cause edema in your legs and ankles. Hot weather may make edema worse. Common medical causes of edema include:  Heart failure.  Liver disease.  Kidney disease.  Weak blood vessels in your legs.  Cancer.  An injury.  Pregnancy.  Some medications.  Obesity.  What are the signs or symptoms? Edema is usually painless.Your skin may look swollen or shiny. How is this diagnosed? Your health care provider may be able to diagnose edema by asking about your medical history and doing a physical exam. You may need to have tests such as X-rays, an  electrocardiogram, or blood tests to check for medical conditions that may cause edema. How is this treated? Edema treatment depends on the cause. If you have heart, liver, or kidney disease, you need the treatment appropriate for these conditions. General treatment may include:  Elevation of the affected body part above the level of your heart.  Compression of the affected body part. Pressure from elastic bandages or support stockings squeezes the tissues and forces fluid back into the blood vessels. This keeps fluid from entering the tissues.  Restriction of fluid and salt intake.  Use of a water pill (diuretic). These medications are appropriate only for some types of edema. They pull fluid out of your body and make you urinate more often. This gets rid of fluid and reduces swelling, but diuretics can have side effects. Only use diuretics as directed by your health care provider.  Follow these instructions at home:  Keep the affected body part above the level of your heart when you are lying down.  Do not  sit still or stand for prolonged periods.  Do not put anything directly under your knees when lying down.  Do not wear constricting clothing or garters on your upper legs.  Exercise your legs to work the fluid back into your blood vessels. This may help the swelling go down.  Wear elastic bandages or support stockings to reduce ankle swelling as directed by your health care provider.  Eat a low-salt diet to reduce fluid if your health care provider recommends it.  Only take medicines as directed by your health care provider. Contact a health care provider if:  Your edema is not responding to treatment.  You have heart, liver, or kidney disease and notice symptoms of edema.  You have edema in your legs that does not improve after elevating them.  You have sudden and unexplained weight gain. Get help right away if:  You develop shortness of breath or chest pain.  You  cannot breathe when you lie down.  You develop pain, redness, or warmth in the swollen areas.  You have heart, liver, or kidney disease and suddenly get edema.  You have a fever and your symptoms suddenly get worse. This information is not intended to replace advice given to you by your health care provider. Make sure you discuss any questions you have with your health care provider. Document Released: 08/11/2005 Document Revised: 01/17/2016 Document Reviewed: 06/03/2013 Elsevier Interactive Patient Education  2017 [employer].

## 2018-03-03 ENCOUNTER — Ambulatory Visit: Payer: Medicaid Other | Admitting: Physician Assistant

## 2018-03-03 LAB — CBC WITH DIFFERENTIAL/PLATELET
BASOS ABS: 0.1 10*3/uL (ref 0.0–0.2)
Basos: 0 %
EOS (ABSOLUTE): 0.1 10*3/uL (ref 0.0–0.4)
EOS: 1 %
Hematocrit: 43.7 % (ref 34.0–46.6)
Hemoglobin: 14.4 g/dL (ref 11.1–15.9)
Immature Grans (Abs): 0 10*3/uL (ref 0.0–0.1)
Immature Granulocytes: 0 %
LYMPHS: 24 %
Lymphocytes Absolute: 2.8 10*3/uL (ref 0.7–3.1)
MCH: 31.9 pg (ref 26.6–33.0)
MCHC: 33 g/dL (ref 31.5–35.7)
MCV: 97 fL (ref 79–97)
MONOCYTES: 9 %
Monocytes Absolute: 1 10*3/uL — ABNORMAL HIGH (ref 0.1–0.9)
NEUTROS ABS: 7.5 10*3/uL — AB (ref 1.4–7.0)
Neutrophils: 66 %
PLATELETS: 248 10*3/uL (ref 150–450)
RBC: 4.52 x10E6/uL (ref 3.77–5.28)
RDW: 14 % (ref 12.3–15.4)
WBC: 11.5 10*3/uL — AB (ref 3.4–10.8)

## 2018-03-03 LAB — CMP14+EGFR
ALBUMIN: 3.6 g/dL (ref 3.5–5.5)
ALT: 13 IU/L (ref 0–32)
AST: 14 IU/L (ref 0–40)
Albumin/Globulin Ratio: 1.2 (ref 1.2–2.2)
Alkaline Phosphatase: 110 IU/L (ref 39–117)
BUN / CREAT RATIO: 22 (ref 9–23)
BUN: 14 mg/dL (ref 6–24)
Bilirubin Total: 0.3 mg/dL (ref 0.0–1.2)
CO2: 26 mmol/L (ref 20–29)
CREATININE: 0.65 mg/dL (ref 0.57–1.00)
Calcium: 8.8 mg/dL (ref 8.7–10.2)
Chloride: 100 mmol/L (ref 96–106)
GFR calc non Af Amer: 104 mL/min/{1.73_m2} (ref >59)
GFR, EST AFRICAN AMERICAN: 120 mL/min/{1.73_m2} (ref >59)
Globulin, Total: 2.9 g/dL (ref 1.5–4.5)
Glucose: 116 mg/dL — ABNORMAL HIGH (ref 65–99)
Potassium: 4.3 mmol/L (ref 3.5–5.2)
Sodium: 139 mmol/L (ref 134–144)
TOTAL PROTEIN: 6.5 g/dL (ref 6.0–8.5)

## 2018-03-03 LAB — TSH: TSH: 2.48 u[IU]/mL (ref 0.450–4.500)

## 2018-03-03 LAB — PROTIME-INR
INR: 0.9 (ref 0.8–1.2)
Prothrombin Time: 9.8 s (ref 9.1–12.0)

## 2018-03-03 LAB — BRAIN NATRIURETIC PEPTIDE: BNP: 43.7 pg/mL (ref 0.0–100.0)

## 2018-03-23 ENCOUNTER — Ambulatory Visit: Payer: Medicaid Other | Admitting: Physician Assistant

## 2018-03-30 ENCOUNTER — Encounter: Payer: Self-pay | Admitting: Physician Assistant

## 2018-04-02 ENCOUNTER — Telehealth: Payer: Self-pay | Admitting: Cardiology

## 2018-04-02 ENCOUNTER — Ambulatory Visit: Payer: Medicaid Other | Admitting: Physician Assistant

## 2018-04-02 ENCOUNTER — Encounter: Payer: Self-pay | Admitting: Physician Assistant

## 2018-04-02 ENCOUNTER — Encounter: Payer: Self-pay | Admitting: Cardiology

## 2018-04-02 ENCOUNTER — Ambulatory Visit: Payer: Medicaid Other | Admitting: Cardiology

## 2018-04-02 VITALS — BP 138/88 | HR 73 | Temp 98.3°F | Ht 66.0 in | Wt 387.4 lb

## 2018-04-02 VITALS — BP 104/72 | HR 76 | Ht 65.0 in | Wt 387.6 lb

## 2018-04-02 DIAGNOSIS — R079 Chest pain, unspecified: Secondary | ICD-10-CM | POA: Diagnosis not present

## 2018-04-02 DIAGNOSIS — R609 Edema, unspecified: Secondary | ICD-10-CM | POA: Diagnosis not present

## 2018-04-02 DIAGNOSIS — R0602 Shortness of breath: Secondary | ICD-10-CM

## 2018-04-02 DIAGNOSIS — I10 Essential (primary) hypertension: Secondary | ICD-10-CM

## 2018-04-02 DIAGNOSIS — R600 Localized edema: Secondary | ICD-10-CM | POA: Diagnosis not present

## 2018-04-02 DIAGNOSIS — D72829 Elevated white blood cell count, unspecified: Secondary | ICD-10-CM | POA: Diagnosis not present

## 2018-04-02 LAB — CMP14+EGFR
A/G RATIO: 1.4 (ref 1.2–2.2)
ALT: 11 IU/L (ref 0–32)
AST: 10 IU/L (ref 0–40)
Albumin: 3.8 g/dL (ref 3.5–5.5)
Alkaline Phosphatase: 109 IU/L (ref 39–117)
BILIRUBIN TOTAL: 0.3 mg/dL (ref 0.0–1.2)
BUN/Creatinine Ratio: 14 (ref 9–23)
BUN: 11 mg/dL (ref 6–24)
CALCIUM: 8.9 mg/dL (ref 8.7–10.2)
CHLORIDE: 102 mmol/L (ref 96–106)
CO2: 29 mmol/L (ref 20–29)
Creatinine, Ser: 0.77 mg/dL (ref 0.57–1.00)
GFR calc Af Amer: 104 mL/min/{1.73_m2} (ref >59)
GFR calc non Af Amer: 90 mL/min/{1.73_m2} (ref >59)
Globulin, Total: 2.8 g/dL (ref 1.5–4.5)
Glucose: 119 mg/dL — ABNORMAL HIGH (ref 65–99)
Potassium: 4.4 mmol/L (ref 3.5–5.2)
Sodium: 142 mmol/L (ref 134–144)
Total Protein: 6.6 g/dL (ref 6.0–8.5)

## 2018-04-02 LAB — CBC WITH DIFFERENTIAL/PLATELET
BASOS: 1 %
Basophils Absolute: 0.1 10*3/uL (ref 0.0–0.2)
EOS (ABSOLUTE): 0.2 10*3/uL (ref 0.0–0.4)
Eos: 2 %
Hematocrit: 44.3 % (ref 34.0–46.6)
Hemoglobin: 14.1 g/dL (ref 11.1–15.9)
IMMATURE GRANULOCYTES: 0 %
Immature Grans (Abs): 0 10*3/uL (ref 0.0–0.1)
Lymphocytes Absolute: 2.5 10*3/uL (ref 0.7–3.1)
Lymphs: 24 %
MCH: 31.9 pg (ref 26.6–33.0)
MCHC: 31.8 g/dL (ref 31.5–35.7)
MCV: 100 fL — ABNORMAL HIGH (ref 79–97)
MONOS ABS: 0.8 10*3/uL (ref 0.1–0.9)
Monocytes: 8 %
NEUTROS PCT: 65 %
Neutrophils Absolute: 6.8 10*3/uL (ref 1.4–7.0)
PLATELETS: 235 10*3/uL (ref 150–450)
RBC: 4.42 x10E6/uL (ref 3.77–5.28)
RDW: 14 % (ref 12.3–15.4)
WBC: 10.4 10*3/uL (ref 3.4–10.8)

## 2018-04-02 MED ORDER — BUMETANIDE 1 MG PO TABS: 1.0000 mg | ORAL_TABLET | Freq: Every day | ORAL | 5 refills | Status: DC

## 2018-04-02 MED ORDER — BUMETANIDE 1 MG PO TABS: 1.0000 mg | ORAL_TABLET | Freq: Two times a day (BID) | ORAL | 1 refills | Status: DC

## 2018-04-02 NOTE — Telephone Encounter (Signed)
°  Precert needed for: ECHO W/Contrast    Location:  [name]     Date: 04/07/18

## 2018-04-02 NOTE — Patient Instructions (Signed)
Medication Instructions:  Your physician has recommended you make the following change in your medication:    CHANGE Bumex 1 mg to twice daily   Please continue all other medications as prescribed  Labwork:  Bmet/mag  Orders given today   Testing/Procedures: Your physician has requested that you have an echocardiogram. Echocardiography is a painless test that uses sound waves to create images of your heart. It provides your doctor with information about the size and shape of your heart and how well your heart's chambers and valves are working. This procedure takes approximately one hour. There are no restrictions for this procedure.  Follow-Up: Your physician recommends that you schedule a follow-up appointment in: 6 WEEKS WITH DR. BRANCH   Any Other Special Instructions Will Be Listed Below (If Applicable).  If you need a refill on your cardiac medications before your next appointment, please call your pharmacy.

## 2018-04-02 NOTE — Patient Instructions (Signed)
In a few days you may receive a survey in the mail or online from [employer] regarding your visit with us today. Please take a moment to fill this out. Your feedback is very important to our whole office. It can help us better understand your needs as well as improve your experience and satisfaction. Thank you for taking your time to complete it. We care about you.  Genesi Stefanko, PA-C  

## 2018-04-02 NOTE — Progress Notes (Signed)
BP 138/88   Pulse 73   Temp 98.3 F (36.8 C) (Oral)   Ht _0  (1.676 m)   Wt (!) 387 lb 6.4 oz (175.7 kg)   BMI 62.53 kg/m    Subjective:    Patient ID: Marissa [name], female    DOB: [birth year], 52 y.o.   MRN: [id number]  HPI: Marissa [name] is a 52 y.o. female presenting on 04/02/2018 for Elevated WBC  This patient comes back in to have her white blood cell count reviewed.  She has persistently had a white blood cell count anywhere from 9.6 up to 12.0.  We have discussed that over the years that this is probably her normal.  On her last visit it was 11.5 so we will recheck it today.  She was just greatly concerned because she did have a granddaughter that had had leukemia.  Reassured her that we will continue to watch this throughout all of her visits in the future.  She will be seeing Dr. Harl Bowie today.  She was started on Bumex.  She states that she cannot tell a great amount of difference.  I have asked her to speak with him about any other options that he would offer for fluid.  She is also to be getting in touch with [facility] for their bariatric seminar. She has an appointment next month for follow-up with me.  Past Medical History:  Diagnosis Date  . Arthritis   . Asthma   . Atelectasis pulmonary   . Back pain   . CHF (congestive heart failure) ([place])    11/2016  . Chronic back pain   . COPD (chronic obstructive pulmonary disease) ([place])   . Depression   . Depression with anxiety   . Dyspnea   . Emphysema lung ([place])   . Emphysema lung ([place])   . Fibromyalgia   . GERD (gastroesophageal reflux disease)   . Headache(784.0)    HX MIGRAINES  . Hypercholesterolemia   . Hypertension   . Insomnia   . Morbid obesity ([place])   . Pneumonia    MARCH   2018  . Requires supplemental oxygen    3 LITERS AT HS  . Sleep apnea    BIPAP +  O2   Relevant past medical, surgical, family and social history reviewed and updated as indicated. Interim medical history since our  last visit reviewed. Allergies and medications reviewed and updated. DATA REVIEWED: CHART IN EPIC  Family History reviewed for pertinent findings.  Review of Systems  Constitutional: Positive for fatigue.  HENT: Negative.   Eyes: Negative.   Respiratory: Positive for shortness of breath and wheezing.   Cardiovascular: Positive for leg swelling. Negative for chest pain and palpitations.  Gastrointestinal: Negative.   Genitourinary: Negative.   Psychiatric/Behavioral: The patient is nervous/anxious.     Allergies as of 04/02/2018      Reactions   Advera [compleat] Itching   Throat Itching   Relpax [eletriptan] Other (See Comments)   Weakness, couldn't move   Advair Diskus [fluticasone-salmeterol]    Ativan [lorazepam]    Citalopram    Paroxetine Hcl    Sertraline    Morphine And Related Itching      Medication List        Accurate as of 04/02/18 11:17 AM. Always use your most recent med list.          albuterol (2.5 MG/3ML) 0.083% nebulizer solution Commonly known as:  PROVENTIL Take 2.5 mg by nebulization  every 6 (six) hours as needed for wheezing or shortness of breath.   albuterol 108 (90 Base) MCG/ACT inhaler Commonly known as:  PROVENTIL HFA;VENTOLIN HFA Inhale 2 puffs into the lungs every 4 (four) hours as needed for wheezing or shortness of breath.   ALPRAZolam 1 MG tablet Commonly known as:  XANAX Take 1 mg by mouth 3 (three) times daily.   bumetanide 1 MG tablet Commonly known as:  BUMEX Take 1 tablet (1 mg total) by mouth daily.   HYDROcodone-acetaminophen 10-325 MG tablet Commonly known as:  NORCO Take 1 tablet by mouth every 6 (six) hours as needed.   HYDROcodone-acetaminophen 10-325 MG tablet Commonly known as:  NORCO Take 1 tablet by mouth every 6 (six) hours as needed.   ibuprofen 200 MG tablet Commonly known as:  ADVIL,MOTRIN Take 800 mg by mouth every 8 (eight) hours as needed for mild pain or moderate pain.   LATUDA 60 MG Tabs Generic  drug:  Lurasidone HCl Take 60 mg daily by mouth.   multivitamin with minerals tablet Take 1 tablet by mouth daily.   naproxen sodium 550 MG tablet Commonly known as:  ANAPROX Take 1 tablet (550 mg total) by mouth every 12 (twelve) hours as needed.   NON FORMULARY Pt uses CPAP machine at bedtime   OXYGEN Inhale 3 L into the lungs at bedtime.   Potassium 99 MG Tabs Take 1 tablet by mouth every other day.   rOPINIRole 2 MG tablet Commonly known as:  REQUIP Take 1 tablet (2 mg total) by mouth 2 (two) times daily.   SYMBICORT 160-4.5 MCG/ACT inhaler Generic drug:  budesonide-formoterol Inhale 2 puffs into the lungs 2 (two) times daily.   Vitamin D (Ergocalciferol) 50000 units Caps capsule Commonly known as:  DRISDOL Take 1 capsule (50,000 Units total) by mouth every 7 (seven) days.   ZYRTEC ALLERGY 10 MG Caps Generic drug:  Cetirizine HCl Take 10 mg by mouth daily.          Objective:    BP 138/88   Pulse 73   Temp 98.3 F (36.8 C) (Oral)   Ht _0  (1.676 m)   Wt (!) 387 lb 6.4 oz (175.7 kg)   BMI 62.53 kg/m   Allergies  Allergen Reactions  . Advera [Compleat] Itching    Throat Itching  . Relpax [Eletriptan] Other (See Comments)    Weakness, couldn't move  . Advair Diskus [Fluticasone-Salmeterol]   . Ativan [Lorazepam]   . Citalopram   . Paroxetine Hcl   . Sertraline   . Morphine And Related Itching    Wt Readings from Last 3 Encounters:  04/02/18 (!) 387 lb 6.4 oz (175.7 kg)  03/02/18 (!) 386 lb (175.1 kg)  01/29/18 (!) 373 lb 6.4 oz (169.4 kg)    Physical Exam  Constitutional: She is oriented to person, place, and time. She appears well-developed and well-nourished.  obese  HENT:  Head: Normocephalic and atraumatic.  Eyes: Pupils are equal, round, and reactive to light. Conjunctivae and EOM are normal.  Cardiovascular: Normal rate, regular rhythm, normal heart sounds and intact distal pulses.  Pulmonary/Chest: Effort normal and breath sounds  normal.  Abdominal: Soft. Bowel sounds are normal.  Neurological: She is alert and oriented to person, place, and time. She has normal reflexes.  Skin: Skin is warm and dry. No rash noted.  Psychiatric: She has a normal mood and affect. Her behavior is normal. Judgment and thought content normal.    Results for orders  placed or performed in visit on 03/02/18  CMP14+EGFR  Result Value Ref Range   Glucose 116 (H) 65 - 99 mg/dL   BUN 14 6 - 24 mg/dL   Creatinine, Ser 0.65 0.57 - 1.00 mg/dL   GFR calc non Af Amer 104 >59 mL/min/1.73   GFR calc Af Amer 120 >59 mL/min/1.73   BUN/Creatinine Ratio 22 9 - 23   Sodium 139 134 - 144 mmol/L   Potassium 4.3 3.5 - 5.2 mmol/L   Chloride 100 96 - 106 mmol/L   CO2 26 20 - 29 mmol/L   Calcium 8.8 8.7 - 10.2 mg/dL   Total Protein 6.5 6.0 - 8.5 g/dL   Albumin 3.6 3.5 - 5.5 g/dL   Globulin, Total 2.9 1.5 - 4.5 g/dL   Albumin/Globulin Ratio 1.2 1.2 - 2.2   Bilirubin Total 0.3 0.0 - 1.2 mg/dL   Alkaline Phosphatase 110 39 - 117 IU/L   AST 14 0 - 40 IU/L   ALT 13 0 - 32 IU/L  CBC with Differential  Result Value Ref Range   WBC 11.5 (H) 3.4 - 10.8 x10E3/uL   RBC 4.52 3.77 - 5.28 x10E6/uL   Hemoglobin 14.4 11.1 - 15.9 g/dL   Hematocrit 43.7 34.0 - 46.6 %   MCV 97 79 - 97 fL   MCH 31.9 26.6 - 33.0 pg   MCHC 33.0 31.5 - 35.7 g/dL   RDW 14.0 12.3 - 15.4 %   Platelets 248 150 - 450 x10E3/uL   Neutrophils 66 Not Estab. %   Lymphs 24 Not Estab. %   Monocytes 9 Not Estab. %   Eos 1 Not Estab. %   Basos 0 Not Estab. %   Neutrophils Absolute 7.5 (H) 1.4 - 7.0 x10E3/uL   Lymphocytes Absolute 2.8 0.7 - 3.1 x10E3/uL   Monocytes Absolute 1.0 (H) 0.1 - 0.9 x10E3/uL   EOS (ABSOLUTE) 0.1 0.0 - 0.4 x10E3/uL   Basophils Absolute 0.1 0.0 - 0.2 x10E3/uL   Immature Granulocytes 0 Not Estab. %   Immature Grans (Abs) 0.0 0.0 - 0.1 x10E3/uL  Brain natriuretic peptide  Result Value Ref Range   BNP 43.7 0.0 - 100.0 pg/mL  TSH  Result Value Ref Range   TSH 2.480  0.450 - 4.500 uIU/mL  Protime-INR  Result Value Ref Range   INR 0.9 0.8 - 1.2   Prothrombin Time 9.8 9.1 - 12.0 sec  Bayer DCA Hb A1c Waived  Result Value Ref Range   HB A1C (BAYER DCA - WAIVED) 6.1 <7.0 %      Assessment & Plan:   1. Leukocytosis, unspecified type - CBC with Differential/Platelet - CMP14+EGFR  2. Edema, unspecified type - bumetanide (BUMEX) 1 MG tablet; Take 1 tablet (1 mg total) by mouth daily.  Dispense: 30 tablet; Refill: 5 Discuss with Dr. Harl Bowie  Continue all other maintenance medications as listed above.  Follow up plan: No follow-ups on file.  Educational handout given for [place] PA-C [place] [address]  [place], [place] [address] [phone]   04/02/2018, 11:17 AM

## 2018-04-02 NOTE — Progress Notes (Signed)
Clinical Summary Ms. Gutter is a 52 y.o.female seen today for follow up of the following medical problems.   1. Chest pain - chest pain started about 2-3 weeks. Worst with breathing, midchest. Severe pain. No other associated symptoms.  - can be worst with walking up stairs at her apartment. No relation to food. - admit to [facility] 10/2016 with symptoms.  - CT PE negative.  -10/2016 echo LVEF 60-65%, normal diastolic function, normal RV  - denies any recent symptoms.    2. Palpitations  - no significant symptoms since last visit.   3. LE edema 10/2016 echo LVEF 60-65%, normal diastolic function, normal atria - back in 11/2016 she was started on lasix 20mg  prn swelling  - on bumex, looks to have been started some point after our visit 11/2016. Prior 2017 echo reported reduced diastolic function. - bumex started at pcp office about 1-2 months ago  - Increased swelling, cyclical with her period. Diffuse swelling, worst knees down. Can get some SOB associated.  - DOE with activities. Sleeps in recliner due to back pain. Bumex urinates 4 hours, then tapers off. Limiting salt intake. TSH 2.48  - 02/2018 labs BNP 43 - this is worst swelling has ever been.  - can have some nonspecific chest pain midchest to midback. Sharp pain. Not positioan. Lasts about 15 minutes. Often comes on with coughing.    4. OSA  - wears bipap at night, followed by pulmonary.    5. COPD  - per pcp  6. Obesity - she reports she has been referred to bariatric surgery.  Past Medical History:  Diagnosis Date  . Arthritis   . Asthma   . Atelectasis pulmonary   . Back pain   . CHF (congestive heart failure) (HCC)    11/2016  . Chronic back pain   . COPD (chronic obstructive pulmonary disease) (HCC)   . Depression   . Depression with anxiety   . Dyspnea   . Emphysema lung (HCC)   . Emphysema lung (HCC)   . Fibromyalgia   . GERD (gastroesophageal reflux disease)   . Headache(784.0)    HX  MIGRAINES  . Hypercholesterolemia   . Hypertension   . Insomnia   . Morbid obesity (HCC)   . Pneumonia    MARCH   2018  . Requires supplemental oxygen    3 LITERS AT HS  . Sleep apnea    BIPAP +  O2     Allergies  Allergen Reactions  . Advera [Compleat] Itching    Throat Itching  . Relpax [Eletriptan] Other (See Comments)    Weakness, couldn't move  . Advair Diskus [Fluticasone-Salmeterol]   . Ativan [Lorazepam]   . Citalopram   . Paroxetine Hcl   . Sertraline   . Morphine And Related Itching     Current Outpatient Medications  Medication Sig Dispense Refill  . albuterol (PROVENTIL HFA;VENTOLIN HFA) 108 (90 BASE) MCG/ACT inhaler Inhale 2 puffs into the lungs every 4 (four) hours as needed for wheezing or shortness of breath. 1 Inhaler 2  . albuterol (PROVENTIL) (2.5 MG/3ML) 0.083% nebulizer solution Take 2.5 mg by nebulization every 6 (six) hours as needed for wheezing or shortness of breath.    . ALPRAZolam (XANAX) 1 MG tablet Take 1 mg by mouth 3 (three) times daily.     . bumetanide (BUMEX) 1 MG tablet Take 1 tablet (1 mg total) by mouth daily. 30 tablet 5  . Cetirizine HCl (ZYRTEC ALLERGY)  10 MG CAPS Take 10 mg by mouth daily.     Marland Kitchen. HYDROcodone-acetaminophen (NORCO) 10-325 MG tablet Take 1 tablet by mouth every 6 (six) hours as needed. 120 tablet 0  . HYDROcodone-acetaminophen (NORCO) 10-325 MG tablet Take 1 tablet by mouth every 6 (six) hours as needed. 120 tablet 0  . ibuprofen (ADVIL,MOTRIN) 200 MG tablet Take 800 mg by mouth every 8 (eight) hours as needed for mild pain or moderate pain.    . Lurasidone HCl (LATUDA) 60 MG TABS Take 60 mg daily by mouth.    . Multiple Vitamins-Minerals (MULTIVITAMIN WITH MINERALS) tablet Take 1 tablet by mouth daily.    . naproxen sodium (ANAPROX DS) 550 MG tablet Take 1 tablet (550 mg total) by mouth every 12 (twelve) hours as needed. 16 tablet 3  . NON FORMULARY Pt uses CPAP machine at bedtime    . OXYGEN Inhale 3 L into the lungs  at bedtime.    . Potassium 99 MG TABS Take 1 tablet by mouth every other day.    Marland Kitchen. rOPINIRole (REQUIP) 2 MG tablet Take 1 tablet (2 mg total) by mouth 2 (two) times daily. 120 tablet 5  . SYMBICORT 160-4.5 MCG/ACT inhaler Inhale 2 puffs into the lungs 2 (two) times daily.  4  . Vitamin D, Ergocalciferol, (DRISDOL) 50000 units CAPS capsule Take 1 capsule (50,000 Units total) by mouth every 7 (seven) days. 4 capsule 11   No current facility-administered medications for this visit.      Past Surgical History:  Procedure Laterality Date  . CESAREAN SECTION    . CHOLECYSTECTOMY    . FOOT SURGERY    . GALLBLADDER SURGERY    . TONSILLECTOMY    . TOOTH EXTRACTION N/A 01/30/2017   Procedure: EXTRACTION TEETH 15, 17, 31;  Surgeon: Ocie DoyneJensen, Scott, DDS;  Location: [facility] OR;  Service: Oral Surgery;  Laterality: N/A;     Allergies  Allergen Reactions  . Advera [Compleat] Itching    Throat Itching  . Relpax [Eletriptan] Other (See Comments)    Weakness, couldn't move  . Advair Diskus [Fluticasone-Salmeterol]   . Ativan [Lorazepam]   . Citalopram   . Paroxetine Hcl   . Sertraline   . Morphine And Related Itching      Family History  Problem Relation Age of Onset  . Congestive Heart Failure Mother   . Hypertension Mother   . Alcoholism Father      Social History Ms. Swarm reports that she has been smoking cigarettes. She has a 8.75 pack-year smoking history. She has never used smokeless tobacco. Ms. Laney Pastorart reports that she does not drink alcohol.   Review of Systems CONSTITUTIONAL: No weight loss, fever, chills, weakness or fatigue.  HEENT: Eyes: No visual loss, blurred vision, double vision or yellow sclerae.No hearing loss, sneezing, congestion, runny nose or sore throat.  SKIN: No rash or itching.  CARDIOVASCULAR: per hpi RESPIRATORY: per hpi  GASTROINTESTINAL: No anorexia, nausea, vomiting or diarrhea. No abdominal pain or blood.  GENITOURINARY: No burning on urination, no  polyuria NEUROLOGICAL: No headache, dizziness, syncope, paralysis, ataxia, numbness or tingling in the extremities. No change in bowel or bladder control.  MUSCULOSKELETAL: No muscle, back pain, joint pain or stiffness.  LYMPHATICS: No enlarged nodes. No history of splenectomy.  PSYCHIATRIC: No history of depression or anxiety.  ENDOCRINOLOGIC: No reports of sweating, cold or heat intolerance. No polyuria or polydipsia.  Marland Kitchen.   Physical Examination Vitals:   04/02/18 1520  BP: 104/72  Pulse:  76  SpO2: 94%   Vitals:   04/02/18 1520  Weight: (!) 387 lb 9.6 oz (175.8 kg)  Height: 5\' 5"  (1.651 m)    Gen: resting comfortably, no acute distress HEENT: no scleral icterus, pupils equal round and reactive, no palptable cervical adenopathy,  CV: RRR, no m/r/g no jvd Resp: Clear to auscultation bilaterally GI: abdomen is soft, non-tender, non-distended, normal bowel sounds, no hepatosplenomegaly MSK: extremities are warm, 1+ bilateral LE edema  Skin: warm, no rash Neuro:  no focal deficits Psych: appropriate affect     Assessment and Plan   1. Chest pain - atypical chest pain in the past, no recent symptoms - continue to monitor.   2. Palpitations - primarily brought on with stress - continue to monitor at this time - EKG today shows normal sinus rhythm  3. LE edema - repeat echo - change bumex to 1mg  bid. Recheck BMET/Mg - if normal echo, or fails to respond to diuretics consider alternative causes of her edema such as severe obesity, venous insufficiecny. Exam not quite consistent with lymphedema but also on the differential.    F/u 6 weeks     Antoine Poche, M.D.

## 2018-04-04 ENCOUNTER — Encounter: Payer: Self-pay | Admitting: Cardiology

## 2018-04-07 ENCOUNTER — Ambulatory Visit (HOSPITAL_COMMUNITY): Admission: RE | Admit: 2018-04-07 | Payer: Medicaid Other | Source: Ambulatory Visit

## 2018-04-08 ENCOUNTER — Ambulatory Visit (HOSPITAL_COMMUNITY): Payer: Medicaid Other

## 2018-04-16 ENCOUNTER — Ambulatory Visit (HOSPITAL_COMMUNITY): Payer: Medicaid Other

## 2018-04-20 ENCOUNTER — Ambulatory Visit (HOSPITAL_COMMUNITY)
Admission: RE | Admit: 2018-04-20 | Discharge: 2018-04-20 | Disposition: A | Discharge status: Home / Self Care | Payer: Medicaid Other | Source: Ambulatory Visit | Attending: Cardiology | Admitting: Cardiology

## 2018-04-20 DIAGNOSIS — R0602 Shortness of breath: Secondary | ICD-10-CM | POA: Insufficient documentation

## 2018-04-20 NOTE — Progress Notes (Signed)
*  PRELIMINARY RESULTS* Echocardiogram 2D Echocardiogram has been performed.  Jeryl Columbialliott, Abbi Mancini 04/20/2018, 1:03 PM

## 2018-04-22 ENCOUNTER — Telehealth: Payer: Self-pay | Admitting: *Deleted

## 2018-04-22 NOTE — Telephone Encounter (Signed)
-----   Message from Antoine PocheJonathan F Branch, MD sent at 04/22/2018 11:16 AM EDT ----- Echo looks good, normal heart function   Dominga FerryJ Branch MD

## 2018-04-22 NOTE — Telephone Encounter (Signed)
Pt aware - routed to pcp  

## 2018-04-28 ENCOUNTER — Encounter: Payer: Self-pay | Admitting: Physician Assistant

## 2018-04-28 ENCOUNTER — Ambulatory Visit: Payer: Medicaid Other | Admitting: Physician Assistant

## 2018-04-28 VITALS — BP 151/94 | HR 89 | Temp 98.8°F | Ht 65.0 in | Wt 387.4 lb

## 2018-04-28 DIAGNOSIS — M5136 Other intervertebral disc degeneration, lumbar region: Secondary | ICD-10-CM

## 2018-04-28 DIAGNOSIS — M779 Enthesopathy, unspecified: Secondary | ICD-10-CM | POA: Diagnosis not present

## 2018-04-28 DIAGNOSIS — I10 Essential (primary) hypertension: Secondary | ICD-10-CM

## 2018-04-28 DIAGNOSIS — M25552 Pain in left hip: Secondary | ICD-10-CM

## 2018-04-28 DIAGNOSIS — M791 Myalgia, unspecified site: Secondary | ICD-10-CM

## 2018-04-28 MED ORDER — HYDROCODONE-ACETAMINOPHEN 10-325 MG PO TABS: 1.0000 | ORAL_TABLET | Freq: Four times a day (QID) | ORAL | 0 refills | Status: DC | PRN

## 2018-05-03 ENCOUNTER — Ambulatory Visit: Payer: Medicaid Other | Admitting: Physician Assistant

## 2018-05-03 NOTE — Progress Notes (Signed)
BP (!) 151/94   Pulse 89   Temp 98.8 F (37.1 C) (Oral)   Ht 5' 5"  (1.651 m)   Wt (!) 387 lb 6.4 oz (175.7 kg)   BMI 64.47 kg/m    Subjective:    Patient ID: Marissa [name], female    DOB: 02-08-1966, 52 y.o.   MRN: [id number]  HPI: Marissa [name] is a 52 y.o. female presenting on 04/28/2018 for Hypertension (3 month follow up ) and leg cramps  The patient did ask about a mobile chair today.  I instructed her that we need to have the entire encounter that is a face-to-face encounter for this.  Her insurance has been certain restrictions on the type of visit and information that they require.  She is going to be in touch with [facility] to see about getting one ordered through them.  Over the past year she has had increase of 70 pounds.  She has had a great diminished in her overall activity related to her chronic back pain and chronic leg pain.  She is looking into bariatric surgery.  She did have an echocardiogram that was normal.  Otherwise we are just reviewing all of her medications today.  Refills will be sent as needed.  Past Medical History:  Diagnosis Date  . Arthritis   . Asthma   . Atelectasis pulmonary   . Back pain   . CHF (congestive heart failure) ([place])    11/2016  . Chronic back pain   . COPD (chronic obstructive pulmonary disease) ([place])   . Depression   . Depression with anxiety   . Dyspnea   . Emphysema lung ([place])   . Emphysema lung ([place])   . Fibromyalgia   . GERD (gastroesophageal reflux disease)   . Headache(784.0)    HX MIGRAINES  . Hypercholesterolemia   . Hypertension   . Insomnia   . Morbid obesity ([place])   . Pneumonia    MARCH   2018  . Requires supplemental oxygen    3 LITERS AT HS  . Sleep apnea    BIPAP +  O2   Relevant past medical, surgical, family and social history reviewed and updated as indicated. Interim medical history since our last visit reviewed. Allergies and medications reviewed and updated. DATA REVIEWED: CHART IN  EPIC  Family History reviewed for pertinent findings.  Review of Systems  Constitutional: Negative.  Negative for activity change, fatigue and fever.  HENT: Negative.   Eyes: Negative.   Respiratory: Positive for shortness of breath. Negative for cough.   Cardiovascular: Positive for leg swelling. Negative for chest pain and palpitations.  Gastrointestinal: Negative.  Negative for abdominal pain.  Endocrine: Negative.   Genitourinary: Negative.  Negative for dysuria.  Musculoskeletal: Positive for arthralgias, back pain, gait problem, joint swelling and myalgias.  Skin: Negative.     Allergies as of 04/28/2018      Reactions   Advera [compleat] Itching   Throat Itching   Relpax [eletriptan] Other (See Comments)   Weakness, couldn't move   Advair Diskus [fluticasone-salmeterol]    Ativan [lorazepam]    Citalopram    Paroxetine Hcl    Sertraline    Morphine And Related Itching      Medication List        Accurate as of 04/28/18 11:59 PM. Always use your most recent med list.          albuterol (2.5 MG/3ML) 0.083% nebulizer solution Commonly known as:  PROVENTIL Take 2.5  mg by nebulization every 6 (six) hours as needed for wheezing or shortness of breath.   albuterol 108 (90 Base) MCG/ACT inhaler Commonly known as:  PROVENTIL HFA;VENTOLIN HFA Inhale 2 puffs into the lungs every 4 (four) hours as needed for wheezing or shortness of breath.   ALPRAZolam 1 MG tablet Commonly known as:  XANAX Take 1 mg by mouth 3 (three) times daily.   bumetanide 1 MG tablet Commonly known as:  BUMEX Take 1 tablet (1 mg total) by mouth 2 (two) times daily.   HYDROcodone-acetaminophen 10-325 MG tablet Commonly known as:  NORCO Take 1 tablet by mouth every 6 (six) hours as needed.   HYDROcodone-acetaminophen 10-325 MG tablet Commonly known as:  NORCO Take 1 tablet by mouth every 6 (six) hours as needed.   HYDROcodone-acetaminophen 10-325 MG tablet Commonly known as:  NORCO Take 1  tablet by mouth every 6 (six) hours as needed.   ibuprofen 200 MG tablet Commonly known as:  ADVIL,MOTRIN Take 800 mg by mouth every 8 (eight) hours as needed for mild pain or moderate pain.   LATUDA 60 MG Tabs Generic drug:  Lurasidone HCl Take 60 mg daily by mouth.   multivitamin with minerals tablet Take 1 tablet by mouth daily.   NON FORMULARY Pt uses CPAP machine at bedtime   OXYGEN Inhale 3 L into the lungs at bedtime.   Potassium 99 MG Tabs Take 1 tablet by mouth as needed.   rOPINIRole 2 MG tablet Commonly known as:  REQUIP Take 1 tablet (2 mg total) by mouth 2 (two) times daily.   SYMBICORT 160-4.5 MCG/ACT inhaler Generic drug:  budesonide-formoterol Inhale 2 puffs into the lungs 2 (two) times daily.   Vitamin D (Ergocalciferol) 50000 units Caps capsule Commonly known as:  DRISDOL Take 1 capsule (50,000 Units total) by mouth every 7 (seven) days.   ZYRTEC ALLERGY 10 MG Caps Generic drug:  Cetirizine HCl Take 10 mg by mouth daily.          Objective:    BP (!) 151/94   Pulse 89   Temp 98.8 F (37.1 C) (Oral)   Ht 5' 5"  (1.651 m)   Wt (!) 387 lb 6.4 oz (175.7 kg)   BMI 64.47 kg/m   Allergies  Allergen Reactions  . Advera [Compleat] Itching    Throat Itching  . Relpax [Eletriptan] Other (See Comments)    Weakness, couldn't move  . Advair Diskus [Fluticasone-Salmeterol]   . Ativan [Lorazepam]   . Citalopram   . Paroxetine Hcl   . Sertraline   . Morphine And Related Itching    Wt Readings from Last 3 Encounters:  04/28/18 (!) 387 lb 6.4 oz (175.7 kg)  04/02/18 (!) 387 lb 9.6 oz (175.8 kg)  04/02/18 (!) 387 lb 6.4 oz (175.7 kg)    Physical Exam  Constitutional: She is oriented to person, place, and time. She appears well-developed and well-nourished.  HENT:  Head: Normocephalic and atraumatic.  Eyes: Pupils are equal, round, and reactive to light. Conjunctivae and EOM are normal.  Cardiovascular: Normal rate, regular rhythm, normal heart  sounds and intact distal pulses.  Pulmonary/Chest: Effort normal and breath sounds normal.  Abdominal: Soft. Bowel sounds are normal.  Neurological: She is alert and oriented to person, place, and time. She has normal reflexes.  Skin: Skin is warm and dry. No rash noted.  Psychiatric: She has a normal mood and affect. Her behavior is normal. Judgment and thought content normal.    Results  for orders placed or performed in visit on 04/02/18  CBC with Differential/Platelet  Result Value Ref Range   WBC 10.4 3.4 - 10.8 x10E3/uL   RBC 4.42 3.77 - 5.28 x10E6/uL   Hemoglobin 14.1 11.1 - 15.9 g/dL   Hematocrit 44.3 34.0 - 46.6 %   MCV 100 (H) 79 - 97 fL   MCH 31.9 26.6 - 33.0 pg   MCHC 31.8 31.5 - 35.7 g/dL   RDW 14.0 12.3 - 15.4 %   Platelets 235 150 - 450 x10E3/uL   Neutrophils 65 Not Estab. %   Lymphs 24 Not Estab. %   Monocytes 8 Not Estab. %   Eos 2 Not Estab. %   Basos 1 Not Estab. %   Neutrophils Absolute 6.8 1.4 - 7.0 x10E3/uL   Lymphocytes Absolute 2.5 0.7 - 3.1 x10E3/uL   Monocytes Absolute 0.8 0.1 - 0.9 x10E3/uL   EOS (ABSOLUTE) 0.2 0.0 - 0.4 x10E3/uL   Basophils Absolute 0.1 0.0 - 0.2 x10E3/uL   Immature Granulocytes 0 Not Estab. %   Immature Grans (Abs) 0.0 0.0 - 0.1 x10E3/uL  CMP14+EGFR  Result Value Ref Range   Glucose 119 (H) 65 - 99 mg/dL   BUN 11 6 - 24 mg/dL   Creatinine, Ser 0.77 0.57 - 1.00 mg/dL   GFR calc non Af Amer 90 >59 mL/min/1.73   GFR calc Af Amer 104 >59 mL/min/1.73   BUN/Creatinine Ratio 14 9 - 23   Sodium 142 134 - 144 mmol/L   Potassium 4.4 3.5 - 5.2 mmol/L   Chloride 102 96 - 106 mmol/L   CO2 29 20 - 29 mmol/L   Calcium 8.9 8.7 - 10.2 mg/dL   Total Protein 6.6 6.0 - 8.5 g/dL   Albumin 3.8 3.5 - 5.5 g/dL   Globulin, Total 2.8 1.5 - 4.5 g/dL   Albumin/Globulin Ratio 1.4 1.2 - 2.2   Bilirubin Total 0.3 0.0 - 1.2 mg/dL   Alkaline Phosphatase 109 39 - 117 IU/L   AST 10 0 - 40 IU/L   ALT 11 0 - 32 IU/L      Assessment & Plan:   1.  Essential hypertension Continue medication  2. Lumbar degenerative disc disease - HYDROcodone-acetaminophen (NORCO) 10-325 MG tablet; Take 1 tablet by mouth every 6 (six) hours as needed.  Dispense: 120 tablet; Refill: 0 - HYDROcodone-acetaminophen (NORCO) 10-325 MG tablet; Take 1 tablet by mouth every 6 (six) hours as needed.  Dispense: 120 tablet; Refill: 0 - HYDROcodone-acetaminophen (NORCO) 10-325 MG tablet; Take 1 tablet by mouth every 6 (six) hours as needed.  Dispense: 120 tablet; Refill: 0  3. Bone spur Continue medication  4. Morbid obesity ([place]) Continue medication  5. Myalgia Continue medication  6. Pain of left hip joint Continue medication   Continue all other maintenance medications as listed above.  Follow up plan: Return in about 2 weeks (around 05/12/2018) for face to face mobile chair.  Educational handout given for Mor[place] PA-C [facility] [address]  [place], [place] [address] [phone]   05/03/2018, 1:08 PM

## 2018-05-05 ENCOUNTER — Ambulatory Visit: Payer: Medicaid Other | Admitting: Pulmonary Disease

## 2018-05-14 ENCOUNTER — Ambulatory Visit: Payer: Medicaid Other | Admitting: Pulmonary Disease

## 2018-05-17 ENCOUNTER — Ambulatory Visit: Payer: Medicaid Other | Admitting: Physician Assistant

## 2018-05-19 ENCOUNTER — Ambulatory Visit: Payer: Medicaid Other | Admitting: Cardiology

## 2018-05-19 NOTE — Progress Notes (Deleted)
Clinical Summary Ms. Leeper is a 52 y.o.female seen today for follow up of the following medical problems.   1. Chest pain - chest pain started about 2-3 weeks. Worst with breathing, midchest. Severe pain. No other associated symptoms.  - can be worst with walking up stairs at her apartment. No relation to food. - admit to [facility] 10/2016 withsymptoms. - CT PE negative.  -10/2016 echo LVEF 60-65%, normal diastolic function, normal RV  - denies any recent symptoms.    2. Palpitations  - no significant symptoms since last visit.   3. LE edema 10/2016 echo LVEF 60-65%, normal diastolic function, normal atria - back in 11/2016 she was started on lasix 20mg  prn swelling  - on bumex, looks to have been started some point after our visit 11/2016. Prior 2017 echo reported reduced diastolic function. - bumex started at pcp office about 1-2 months ago  - Increased swelling, cyclical with her period. Diffuse swelling, worst knees down. Can get some SOB associated.  - DOE with activities. Sleeps in recliner due to back pain. Bumex urinates 4 hours, then tapers off. Limiting salt intake. TSH 2.48  - 02/2018 labs BNP 43 - this is worst swelling has ever been.  - can have some nonspecific chest pain midchest to midback. Sharp pain. Not positioan. Lasts about 15 minutes. Often comes on with coughing.   03/2018 echo LVEF 50%, normal diastolic function - last visit changed to bumex 1mg  bid. ??repeat labs   4. OSA  - wears bipap at night, followed by pulmonary.    5. COPD  - per pcp  6. Obesity - she reports she has been referred to bariatric surgery.    Past Medical History:  Diagnosis Date  . Arthritis   . Asthma   . Atelectasis pulmonary   . Back pain   . CHF (congestive heart failure) (HCC)    11/2016  . Chronic back pain   . COPD (chronic obstructive pulmonary disease) (HCC)   . Depression   . Depression with anxiety   . Dyspnea   . Emphysema lung (HCC)   .  Emphysema lung (HCC)   . Fibromyalgia   . GERD (gastroesophageal reflux disease)   . Headache(784.0)    HX MIGRAINES  . Hypercholesterolemia   . Hypertension   . Insomnia   . Morbid obesity (HCC)   . Pneumonia    MARCH   2018  . Requires supplemental oxygen    3 LITERS AT HS  . Sleep apnea    BIPAP +  O2     Allergies  Allergen Reactions  . Advera [Compleat] Itching    Throat Itching  . Relpax [Eletriptan] Other (See Comments)    Weakness, couldn't move  . Advair Diskus [Fluticasone-Salmeterol]   . Ativan [Lorazepam]   . Citalopram   . Paroxetine Hcl   . Sertraline   . Morphine And Related Itching     Current Outpatient Medications  Medication Sig Dispense Refill  . albuterol (PROVENTIL HFA;VENTOLIN HFA) 108 (90 BASE) MCG/ACT inhaler Inhale 2 puffs into the lungs every 4 (four) hours as needed for wheezing or shortness of breath. 1 Inhaler 2  . albuterol (PROVENTIL) (2.5 MG/3ML) 0.083% nebulizer solution Take 2.5 mg by nebulization every 6 (six) hours as needed for wheezing or shortness of breath.    . ALPRAZolam (XANAX) 1 MG tablet Take 1 mg by mouth 3 (three) times daily.     . bumetanide (BUMEX) 1 MG tablet  Take 1 tablet (1 mg total) by mouth 2 (two) times daily. 60 tablet 1  . Cetirizine HCl (ZYRTEC ALLERGY) 10 MG CAPS Take 10 mg by mouth daily.     Marland Kitchen HYDROcodone-acetaminophen (NORCO) 10-325 MG tablet Take 1 tablet by mouth every 6 (six) hours as needed. 120 tablet 0  . HYDROcodone-acetaminophen (NORCO) 10-325 MG tablet Take 1 tablet by mouth every 6 (six) hours as needed. 120 tablet 0  . HYDROcodone-acetaminophen (NORCO) 10-325 MG tablet Take 1 tablet by mouth every 6 (six) hours as needed. 120 tablet 0  . ibuprofen (ADVIL,MOTRIN) 200 MG tablet Take 800 mg by mouth every 8 (eight) hours as needed for mild pain or moderate pain.    . Lurasidone HCl (LATUDA) 60 MG TABS Take 60 mg daily by mouth.    . Multiple Vitamins-Minerals (MULTIVITAMIN WITH MINERALS) tablet Take 1  tablet by mouth daily.    . NON FORMULARY Pt uses CPAP machine at bedtime    . OXYGEN Inhale 3 L into the lungs at bedtime.    . Potassium 99 MG TABS Take 1 tablet by mouth as needed.     Marland Kitchen rOPINIRole (REQUIP) 2 MG tablet Take 1 tablet (2 mg total) by mouth 2 (two) times daily. 120 tablet 5  . SYMBICORT 160-4.5 MCG/ACT inhaler Inhale 2 puffs into the lungs 2 (two) times daily.  4  . Vitamin D, Ergocalciferol, (DRISDOL) 50000 units CAPS capsule Take 1 capsule (50,000 Units total) by mouth every 7 (seven) days. 4 capsule 11   No current facility-administered medications for this visit.      Past Surgical History:  Procedure Laterality Date  . CESAREAN SECTION    . CHOLECYSTECTOMY    . FOOT SURGERY    . GALLBLADDER SURGERY    . TONSILLECTOMY    . TOOTH EXTRACTION N/A 01/30/2017   Procedure: EXTRACTION TEETH 15, 17, 31;  Surgeon: Ocie Doyne, DDS;  Location: [facility] OR;  Service: Oral Surgery;  Laterality: N/A;     Allergies  Allergen Reactions  . Advera [Compleat] Itching    Throat Itching  . Relpax [Eletriptan] Other (See Comments)    Weakness, couldn't move  . Advair Diskus [Fluticasone-Salmeterol]   . Ativan [Lorazepam]   . Citalopram   . Paroxetine Hcl   . Sertraline   . Morphine And Related Itching      Family History  Problem Relation Age of Onset  . Congestive Heart Failure Mother   . Hypertension Mother   . Alcoholism Father      Social History Ms. Skaggs reports that she has been smoking cigarettes. She has a 8.75 pack-year smoking history. She has never used smokeless tobacco. Ms. Gunn reports that she does not drink alcohol.   Review of Systems CONSTITUTIONAL: No weight loss, fever, chills, weakness or fatigue.  HEENT: Eyes: No visual loss, blurred vision, double vision or yellow sclerae.No hearing loss, sneezing, congestion, runny nose or sore throat.  SKIN: No rash or itching.  CARDIOVASCULAR:  RESPIRATORY: No shortness of breath, cough or sputum.    GASTROINTESTINAL: No anorexia, nausea, vomiting or diarrhea. No abdominal pain or blood.  GENITOURINARY: No burning on urination, no polyuria NEUROLOGICAL: No headache, dizziness, syncope, paralysis, ataxia, numbness or tingling in the extremities. No change in bowel or bladder control.  MUSCULOSKELETAL: No muscle, back pain, joint pain or stiffness.  LYMPHATICS: No enlarged nodes. No history of splenectomy.  PSYCHIATRIC: No history of depression or anxiety.  ENDOCRINOLOGIC: No reports of sweating, cold or heat  intolerance. No polyuria or polydipsia.  Marland Kitchen   Physical Examination There were no vitals filed for this visit. There were no vitals filed for this visit.  Gen: resting comfortably, no acute distress HEENT: no scleral icterus, pupils equal round and reactive, no palptable cervical adenopathy,  CV Resp: Clear to auscultation bilaterally GI: abdomen is soft, non-tender, non-distended, normal bowel sounds, no hepatosplenomegaly MSK: extremities are warm, no edema.  Skin: warm, no rash Neuro:  no focal deficits Psych: appropriate affect   Diagnostic Studies  03/2018 echo Study Conclusions  - Left ventricle: Images are limited overall. The cavity size was   normal. Wall thickness was increased in a pattern of mild LVH.   Systolic function was normal. The estimated ejection fraction was   50%. Images were inadequate for LV wall motion assessment. Left   ventricular diastolic function parameters were normal. - Aortic valve: Poorly visualized. - Mitral valve: There was trivial regurgitation. - Right atrium: Central venous pressure (est): 3 mm Hg. - Atrial septum: No defect or patent foramen ovale was identified. - Tricuspid valve: There was trivial regurgitation. - Pulmonary arteries: Systolic pressure could not be accurately   estimated. - Pericardium, extracardiac: A prominent pericardial fat pad was   present.   Assessment and Plan  1. Chest pain - atypical chest pain  in the past, no recent symptoms - continue to monitor.   2. Palpitations - primarily brought on with stress - continue to monitor at this time - EKG today shows normal sinus rhythm  3. LE edema - repeat echo - change bumex to 1mg  bid. Recheck BMET/Mg - if normal echo, or fails to respond to diuretics consider alternative causes of her edema such as severe obesity, venous insufficiecny. Exam not quite consistent with lymphedema but also on the differential.       Antoine Poche, M.D.

## 2018-05-28 ENCOUNTER — Ambulatory Visit: Payer: Medicaid Other | Admitting: Family Medicine

## 2018-06-01 ENCOUNTER — Telehealth: Payer: Self-pay

## 2018-06-01 NOTE — Telephone Encounter (Signed)
Medicaid approved Hydrocodone Acetaminophen 10-325mg  05/30/18 to 07/03/18

## 2018-06-16 ENCOUNTER — Telehealth: Payer: Self-pay | Admitting: Physician Assistant

## 2018-06-16 NOTE — Telephone Encounter (Signed)
Patient aware.

## 2018-06-16 NOTE — Telephone Encounter (Signed)
Over the counter magnesium, start with about 500 mg twice daily and recheck in 2-3 weeks

## 2018-06-30 ENCOUNTER — Ambulatory Visit: Payer: Medicaid Other | Admitting: Physician Assistant

## 2018-06-30 ENCOUNTER — Encounter: Payer: Self-pay | Admitting: Physician Assistant

## 2018-06-30 VITALS — BP 147/87 | HR 95 | Temp 98.9°F | Ht 65.0 in | Wt 381.8 lb

## 2018-06-30 DIAGNOSIS — J439 Emphysema, unspecified: Secondary | ICD-10-CM

## 2018-06-30 DIAGNOSIS — J40 Bronchitis, not specified as acute or chronic: Secondary | ICD-10-CM | POA: Diagnosis not present

## 2018-06-30 MED ORDER — METHYLPREDNISOLONE ACETATE 80 MG/ML IJ SUSP
80.0000 mg | Freq: Once | INTRAMUSCULAR | Status: AC
  Administered 2018-06-30: 80 mg via INTRAMUSCULAR

## 2018-06-30 MED ORDER — ONDANSETRON 8 MG PO TBDP: 8.0000 mg | ORAL_TABLET | Freq: Three times a day (TID) | ORAL | 0 refills | Status: DC | PRN

## 2018-06-30 MED ORDER — ALBUTEROL SULFATE (2.5 MG/3ML) 0.083% IN NEBU: 2.5000 mg | INHALATION_SOLUTION | Freq: Four times a day (QID) | RESPIRATORY_TRACT | 11 refills | Status: AC | PRN

## 2018-06-30 MED ORDER — CEFDINIR 300 MG PO CAPS: 300.0000 mg | ORAL_CAPSULE | Freq: Two times a day (BID) | ORAL | 0 refills | Status: DC

## 2018-06-30 NOTE — Progress Notes (Signed)
BP (!) 147/87   Pulse 95   Temp 98.9 F (37.2 C) (Oral)   Ht '5\' 5"'$  (1.651 m)   Wt (!) 381 lb 12.8 oz (173.2 kg)   SpO2 91% Comment: resting on room air  BMI 63.53 kg/m    Subjective:    Patient ID: Marissa [name], female    DOB: 23-Oct-1965, 52 y.o.   MRN: [id number]  HPI: Anhthu Perdew is a 52 y.o. female presenting on 06/30/2018 for Sore Throat (drainage and some nausea); Cough; Shortness of Breath; and Ear Pain  Patient with several days of progressing upper respiratory and bronchial symptoms. Initially there was more upper respiratory congestion. This progressed to having significant cough that is productive throughout the day and severe at night. There is occasional wheezing after coughing. Sometimes there is slight dyspnea on exertion. It is productive mucus that is yellow in color. Denies any blood.   Past Medical History:  Diagnosis Date  . Arthritis   . Asthma   . Atelectasis pulmonary   . Back pain   . CHF (congestive heart failure) ([place])    11/2016  . Chronic back pain   . COPD (chronic obstructive pulmonary disease) ([place])   . Depression   . Depression with anxiety   . Dyspnea   . Emphysema lung ([place])   . Emphysema lung ([place])   . Fibromyalgia   . GERD (gastroesophageal reflux disease)   . Headache(784.0)    HX MIGRAINES  . Hypercholesterolemia   . Hypertension   . Insomnia   . Morbid obesity ([place])   . Pneumonia    MARCH   2018  . Requires supplemental oxygen    3 LITERS AT HS  . Sleep apnea    BIPAP +  O2   Relevant past medical, surgical, family and social history reviewed and updated as indicated. Interim medical history since our last visit reviewed. Allergies and medications reviewed and updated. DATA REVIEWED: CHART IN EPIC  Family History reviewed for pertinent findings.  Review of Systems  Constitutional: Positive for chills and fatigue. Negative for activity change and appetite change.  HENT: Positive for congestion, postnasal drip and sore  throat.   Eyes: Negative.   Respiratory: Positive for cough and wheezing.   Cardiovascular: Negative.  Negative for chest pain, palpitations and leg swelling.  Gastrointestinal: Negative.   Genitourinary: Negative.   Musculoskeletal: Negative.   Skin: Negative.   Neurological: Positive for headaches.    Allergies as of 06/30/2018      Reactions   Advera [compleat] Itching   Throat Itching   Relpax [eletriptan] Other (See Comments)   Weakness, couldn't move   Advair Diskus [fluticasone-salmeterol]    Ativan [lorazepam]    Citalopram    Paroxetine Hcl    Sertraline    Morphine And Related Itching      Medication List        Accurate as of 06/30/18 10:45 PM. Always use your most recent med list.          albuterol 108 (90 Base) MCG/ACT inhaler Commonly known as:  PROVENTIL HFA;VENTOLIN HFA Inhale 2 puffs into the lungs every 4 (four) hours as needed for wheezing or shortness of breath.   albuterol (2.5 MG/3ML) 0.083% nebulizer solution Commonly known as:  PROVENTIL Take 3 mLs (2.5 mg total) by nebulization every 6 (six) hours as needed for wheezing or shortness of breath.   ALPRAZolam 1 MG tablet Commonly known as:  XANAX Take 1 mg by mouth 3 (  three) times daily.   bumetanide 1 MG tablet Commonly known as:  BUMEX Take 1 tablet (1 mg total) by mouth 2 (two) times daily.   cefdinir 300 MG capsule Commonly known as:  OMNICEF Take 1 capsule (300 mg total) by mouth 2 (two) times daily. 1 po BID   HYDROcodone-acetaminophen 10-325 MG tablet Commonly known as:  NORCO Take 1 tablet by mouth every 6 (six) hours as needed.   HYDROcodone-acetaminophen 10-325 MG tablet Commonly known as:  NORCO Take 1 tablet by mouth every 6 (six) hours as needed.   HYDROcodone-acetaminophen 10-325 MG tablet Commonly known as:  NORCO Take 1 tablet by mouth every 6 (six) hours as needed.   ibuprofen 200 MG tablet Commonly known as:  ADVIL,MOTRIN Take 800 mg by mouth every 8 (eight)  hours as needed for mild pain or moderate pain.   multivitamin with minerals tablet Take 1 tablet by mouth daily.   NON FORMULARY Pt uses CPAP machine at bedtime   ondansetron 8 MG disintegrating tablet Commonly known as:  ZOFRAN-ODT Take 1 tablet (8 mg total) by mouth every 8 (eight) hours as needed for nausea or vomiting.   OXYGEN Inhale 3 L into the lungs at bedtime.   Potassium 99 MG Tabs Take 1 tablet by mouth as needed.   rOPINIRole 2 MG tablet Commonly known as:  REQUIP Take 1 tablet (2 mg total) by mouth 2 (two) times daily.   SYMBICORT 160-4.5 MCG/ACT inhaler Generic drug:  budesonide-formoterol Inhale 2 puffs into the lungs 2 (two) times daily.   VALIUM PO Take by mouth.   Vitamin D (Ergocalciferol) 1.25 MG (50000 UT) Caps capsule Commonly known as:  DRISDOL Take 1 capsule (50,000 Units total) by mouth every 7 (seven) days.   ZYRTEC ALLERGY 10 MG Caps Generic drug:  Cetirizine HCl Take 10 mg by mouth daily.          Objective:    BP (!) 147/87   Pulse 95   Temp 98.9 F (37.2 C) (Oral)   Ht '5\' 5"'$  (1.651 m)   Wt (!) 381 lb 12.8 oz (173.2 kg)   SpO2 91% Comment: resting on room air  BMI 63.53 kg/m   Allergies  Allergen Reactions  . Advera [Compleat] Itching    Throat Itching  . Relpax [Eletriptan] Other (See Comments)    Weakness, couldn't move  . Advair Diskus [Fluticasone-Salmeterol]   . Ativan [Lorazepam]   . Citalopram   . Paroxetine Hcl   . Sertraline   . Morphine And Related Itching    Wt Readings from Last 3 Encounters:  06/30/18 (!) 381 lb 12.8 oz (173.2 kg)  04/28/18 (!) 387 lb 6.4 oz (175.7 kg)  04/02/18 (!) 387 lb 9.6 oz (175.8 kg)    Physical Exam  Constitutional: She is oriented to person, place, and time. She appears well-developed and well-nourished.  HENT:  Head: Normocephalic and atraumatic.  Right Ear: There is drainage and tenderness.  Left Ear: There is drainage and tenderness.  Nose: Mucosal edema and rhinorrhea  present. Right sinus exhibits no maxillary sinus tenderness and no frontal sinus tenderness. Left sinus exhibits no maxillary sinus tenderness and no frontal sinus tenderness.  Mouth/Throat: Oropharyngeal exudate and posterior oropharyngeal erythema present.  Eyes: Pupils are equal, round, and reactive to light. Conjunctivae and EOM are normal.  Neck: Normal range of motion. Neck supple.  Cardiovascular: Normal rate, regular rhythm, normal heart sounds and intact distal pulses.  Pulmonary/Chest: Effort normal. She has wheezes in  the right upper field and the left upper field.  Abdominal: Soft. Bowel sounds are normal.  Neurological: She is alert and oriented to person, place, and time. She has normal reflexes.  Skin: Skin is warm and dry. No rash noted.  Psychiatric: She has a normal mood and affect. Her behavior is normal. Judgment and thought content normal.    Results for orders placed or performed in visit on 04/02/18  CBC with Differential/Platelet  Result Value Ref Range   WBC 10.4 3.4 - 10.8 x10E3/uL   RBC 4.42 3.77 - 5.28 x10E6/uL   Hemoglobin 14.1 11.1 - 15.9 g/dL   Hematocrit 44.3 34.0 - 46.6 %   MCV 100 (H) 79 - 97 fL   MCH 31.9 26.6 - 33.0 pg   MCHC 31.8 31.5 - 35.7 g/dL   RDW 14.0 12.3 - 15.4 %   Platelets 235 150 - 450 x10E3/uL   Neutrophils 65 Not Estab. %   Lymphs 24 Not Estab. %   Monocytes 8 Not Estab. %   Eos 2 Not Estab. %   Basos 1 Not Estab. %   Neutrophils Absolute 6.8 1.4 - 7.0 x10E3/uL   Lymphocytes Absolute 2.5 0.7 - 3.1 x10E3/uL   Monocytes Absolute 0.8 0.1 - 0.9 x10E3/uL   EOS (ABSOLUTE) 0.2 0.0 - 0.4 x10E3/uL   Basophils Absolute 0.1 0.0 - 0.2 x10E3/uL   Immature Granulocytes 0 Not Estab. %   Immature Grans (Abs) 0.0 0.0 - 0.1 x10E3/uL  CMP14+EGFR  Result Value Ref Range   Glucose 119 (H) 65 - 99 mg/dL   BUN 11 6 - 24 mg/dL   Creatinine, Ser 0.77 0.57 - 1.00 mg/dL   GFR calc non Af Amer 90 >59 mL/min/1.73   GFR calc Af Amer 104 >59 mL/min/1.73    BUN/Creatinine Ratio 14 9 - 23   Sodium 142 134 - 144 mmol/L   Potassium 4.4 3.5 - 5.2 mmol/L   Chloride 102 96 - 106 mmol/L   CO2 29 20 - 29 mmol/L   Calcium 8.9 8.7 - 10.2 mg/dL   Total Protein 6.6 6.0 - 8.5 g/dL   Albumin 3.8 3.5 - 5.5 g/dL   Globulin, Total 2.8 1.5 - 4.5 g/dL   Albumin/Globulin Ratio 1.4 1.2 - 2.2   Bilirubin Total 0.3 0.0 - 1.2 mg/dL   Alkaline Phosphatase 109 39 - 117 IU/L   AST 10 0 - 40 IU/L   ALT 11 0 - 32 IU/L      Assessment & Plan:   1. Bronchitis - methylPREDNISolone acetate (DEPO-MEDROL) injection 80 mg - cefdinir (OMNICEF) 300 MG capsule; Take 1 capsule (300 mg total) by mouth 2 (two) times daily. 1 po BID  Dispense: 20 capsule; Refill: 0  2. Pulmonary emphysema, unspecified emphysema type ([place]) conintue medications   Continue all other maintenance medications as listed above.  Follow up plan: No follow-ups on file.  Educational handout given for [place] PA-C [place] [address]  [place], [place] [address] [phone]   06/30/2018, 10:45 PM

## 2018-07-07 ENCOUNTER — Ambulatory Visit: Payer: Medicaid Other | Admitting: Physician Assistant

## 2018-07-16 ENCOUNTER — Other Ambulatory Visit: Payer: Self-pay | Admitting: Physician Assistant

## 2018-07-23 ENCOUNTER — Other Ambulatory Visit: Payer: Self-pay | Admitting: Physician Assistant

## 2018-07-23 MED ORDER — BUDESONIDE-FORMOTEROL FUMARATE 160-4.5 MCG/ACT IN AERO: 2.0000 | INHALATION_SPRAY | Freq: Two times a day (BID) | RESPIRATORY_TRACT | 0 refills | Status: DC

## 2018-07-23 NOTE — Telephone Encounter (Signed)
Left patient a voicemail to prescription was sent in.

## 2018-07-29 ENCOUNTER — Ambulatory Visit (INDEPENDENT_AMBULATORY_CARE_PROVIDER_SITE_OTHER): Payer: Medicaid Other | Admitting: Physician Assistant

## 2018-07-29 ENCOUNTER — Encounter: Payer: Self-pay | Admitting: Physician Assistant

## 2018-07-29 VITALS — BP 133/75 | HR 80 | Temp 97.7°F | Ht 65.0 in | Wt 385.0 lb

## 2018-07-29 DIAGNOSIS — M5136 Other intervertebral disc degeneration, lumbar region: Secondary | ICD-10-CM | POA: Diagnosis not present

## 2018-07-29 DIAGNOSIS — M25552 Pain in left hip: Secondary | ICD-10-CM

## 2018-07-29 DIAGNOSIS — I10 Essential (primary) hypertension: Secondary | ICD-10-CM

## 2018-07-29 DIAGNOSIS — M62838 Other muscle spasm: Secondary | ICD-10-CM | POA: Diagnosis not present

## 2018-07-29 DIAGNOSIS — M25551 Pain in right hip: Secondary | ICD-10-CM

## 2018-07-29 DIAGNOSIS — M7918 Myalgia, other site: Secondary | ICD-10-CM | POA: Diagnosis not present

## 2018-07-29 DIAGNOSIS — M791 Myalgia, unspecified site: Secondary | ICD-10-CM

## 2018-08-01 MED ORDER — HYDROCODONE-ACETAMINOPHEN 10-325 MG PO TABS
1.0000 | ORAL_TABLET | ORAL | 0 refills | Status: DC | PRN
Start: 2018-08-01 — End: 2018-08-27

## 2018-08-01 MED ORDER — HYDROCODONE-ACETAMINOPHEN 10-325 MG PO TABS: 1.0000 | ORAL_TABLET | ORAL | 0 refills | Status: DC | PRN

## 2018-08-01 NOTE — Progress Notes (Signed)
BP 133/75   Pulse 80   Temp 97.7 F (36.5 C) (Oral)   Ht _0  (1.651 m)   Wt (!) 385 lb (174.6 kg)   BMI 64.07 kg/m    Subjective:    Patient ID: Marissa [name], female    DOB: 09-13-1965, 52 y.o.   MRN: [id number]  HPI: Marissa [name] is a 52 y.o. female presenting on 07/29/2018 for Hip Pain; Leg Pain; Cough; and Medication Refill This patient comes in for periodic recheck on medications and conditions including hypertension, lumbar pain, myalgia, joint pain, obesity.  She has lots of joing pain that is giving her much hip pain. She has decreased ROM of the hips and lumbar spine.    All medications are reviewed today. There are no reports of any problems with the medications. All of the medical conditions are reviewed and updated.  Lab work is reviewed and will be ordered as medically necessary. There are no new problems reported with today's visit.    Past Medical History:  Diagnosis Date  . Arthritis   . Asthma   . Atelectasis pulmonary   . Back pain   . CHF (congestive heart failure) ([place])    11/2016  . Chronic back pain   . COPD (chronic obstructive pulmonary disease) ([place])   . Depression   . Depression with anxiety   . Dyspnea   . Emphysema lung ([place])   . Emphysema lung ([place])   . Fibromyalgia   . GERD (gastroesophageal reflux disease)   . Headache(784.0)    HX MIGRAINES  . Hypercholesterolemia   . Hypertension   . Insomnia   . Morbid obesity ([place])   . Pneumonia    MARCH   2018  . Requires supplemental oxygen    3 LITERS AT HS  . Sleep apnea    BIPAP +  O2   Relevant past medical, surgical, family and social history reviewed and updated as indicated. Interim medical history since our last visit reviewed. Allergies and medications reviewed and updated. DATA REVIEWED: CHART IN EPIC  Family History reviewed for pertinent findings.  Review of Systems  Constitutional: Negative.   HENT: Negative.   Eyes: Negative.   Respiratory: Negative.   Gastrointestinal:  Negative.   Genitourinary: Negative.   Musculoskeletal: Positive for arthralgias, back pain, joint swelling and myalgias.  Skin: Negative.   Psychiatric/Behavioral: Positive for decreased concentration. The patient is nervous/anxious.     Allergies as of 07/29/2018      Reactions   Advera [compleat] Itching   Throat Itching   Relpax [eletriptan] Other (See Comments)   Weakness, couldn't move   Advair Diskus [fluticasone-salmeterol]    Ativan [lorazepam]    Citalopram    Paroxetine Hcl    Sertraline    Morphine And Related Itching      Medication List        Accurate as of 07/29/18 11:59 PM. Always use your most recent med list.          albuterol 108 (90 Base) MCG/ACT inhaler Commonly known as:  PROVENTIL HFA;VENTOLIN HFA Inhale 2 puffs into the lungs every 4 (four) hours as needed for wheezing or shortness of breath.   albuterol (2.5 MG/3ML) 0.083% nebulizer solution Commonly known as:  PROVENTIL Take 3 mLs (2.5 mg total) by nebulization every 6 (six) hours as needed for wheezing or shortness of breath.   ALPRAZolam 1 MG tablet Commonly known as:  XANAX Take 1 mg by mouth 3 (three) times daily.  budesonide-formoterol 160-4.5 MCG/ACT inhaler Commonly known as:  SYMBICORT Inhale 2 puffs into the lungs 2 (two) times daily.   bumetanide 1 MG tablet Commonly known as:  BUMEX Take 1 tablet (1 mg total) by mouth 2 (two) times daily.   cefdinir 300 MG capsule Commonly known as:  OMNICEF Take 1 capsule (300 mg total) by mouth 2 (two) times daily. 1 po BID   HYDROcodone-acetaminophen 10-325 MG tablet Commonly known as:  NORCO Take 1 tablet by mouth every 4 (four) hours as needed.   HYDROcodone-acetaminophen 10-325 MG tablet Commonly known as:  NORCO Take 1 tablet by mouth every 4 (four) hours as needed.   HYDROcodone-acetaminophen 10-325 MG tablet Commonly known as:  NORCO Take 1 tablet by mouth every 4 (four) hours as needed.   ibuprofen 200 MG tablet Commonly  known as:  ADVIL,MOTRIN Take 800 mg by mouth every 8 (eight) hours as needed for mild pain or moderate pain.   multivitamin with minerals tablet Take 1 tablet by mouth daily.   NON FORMULARY Pt uses CPAP machine at bedtime   ondansetron 8 MG disintegrating tablet Commonly known as:  ZOFRAN-ODT Take 1 tablet (8 mg total) by mouth every 8 (eight) hours as needed for nausea or vomiting.   ondansetron 8 MG tablet Commonly known as:  ZOFRAN TAKE 1 TABLET EVERY 8 HOURS AS NEEDED FOR NAUSEA AND VOMITING   OXYGEN Inhale 3 L into the lungs at bedtime.   Potassium 99 MG Tabs Take 1 tablet by mouth as needed.   rOPINIRole 2 MG tablet Commonly known as:  REQUIP Take 1 tablet (2 mg total) by mouth 2 (two) times daily.   VALIUM PO Take by mouth.   Vitamin D (Ergocalciferol) 1.25 MG (50000 UT) Caps capsule Commonly known as:  DRISDOL Take 1 capsule (50,000 Units total) by mouth every 7 (seven) days.   ZYRTEC ALLERGY 10 MG Caps Generic drug:  Cetirizine HCl Take 10 mg by mouth daily.          Objective:    BP 133/75   Pulse 80   Temp 97.7 F (36.5 C) (Oral)   Ht _0  (1.651 m)   Wt (!) 385 lb (174.6 kg)   BMI 64.07 kg/m   Allergies  Allergen Reactions  . Advera [Compleat] Itching    Throat Itching  . Relpax [Eletriptan] Other (See Comments)    Weakness, couldn't move  . Advair Diskus [Fluticasone-Salmeterol]   . Ativan [Lorazepam]   . Citalopram   . Paroxetine Hcl   . Sertraline   . Morphine And Related Itching    Wt Readings from Last 3 Encounters:  07/29/18 (!) 385 lb (174.6 kg)  06/30/18 (!) 381 lb 12.8 oz (173.2 kg)  04/28/18 (!) 387 lb 6.4 oz (175.7 kg)    Physical Exam  Constitutional: She is oriented to person, place, and time. She appears well-developed and well-nourished.  HENT:  Head: Normocephalic and atraumatic.  Eyes: Pupils are equal, round, and reactive to light. Conjunctivae and EOM are normal.  Cardiovascular: Normal rate, regular rhythm,  normal heart sounds and intact distal pulses.  Pulmonary/Chest: Effort normal and breath sounds normal.  Abdominal: Soft. Bowel sounds are normal.  Musculoskeletal:       Lumbar back: She exhibits decreased range of motion, tenderness, pain and spasm.  Neurological: She is alert and oriented to person, place, and time. She has normal reflexes.  Skin: Skin is warm and dry. No rash noted.  Psychiatric: She has a  normal mood and affect. Her behavior is normal. Judgment and thought content normal.    Results for orders placed or performed in visit on 04/02/18  CBC with Differential/Platelet  Result Value Ref Range   WBC 10.4 3.4 - 10.8 x10E3/uL   RBC 4.42 3.77 - 5.28 x10E6/uL   Hemoglobin 14.1 11.1 - 15.9 g/dL   Hematocrit 44.3 34.0 - 46.6 %   MCV 100 (H) 79 - 97 fL   MCH 31.9 26.6 - 33.0 pg   MCHC 31.8 31.5 - 35.7 g/dL   RDW 14.0 12.3 - 15.4 %   Platelets 235 150 - 450 x10E3/uL   Neutrophils 65 Not Estab. %   Lymphs 24 Not Estab. %   Monocytes 8 Not Estab. %   Eos 2 Not Estab. %   Basos 1 Not Estab. %   Neutrophils Absolute 6.8 1.4 - 7.0 x10E3/uL   Lymphocytes Absolute 2.5 0.7 - 3.1 x10E3/uL   Monocytes Absolute 0.8 0.1 - 0.9 x10E3/uL   EOS (ABSOLUTE) 0.2 0.0 - 0.4 x10E3/uL   Basophils Absolute 0.1 0.0 - 0.2 x10E3/uL   Immature Granulocytes 0 Not Estab. %   Immature Grans (Abs) 0.0 0.0 - 0.1 x10E3/uL  CMP14+EGFR  Result Value Ref Range   Glucose 119 (H) 65 - 99 mg/dL   BUN 11 6 - 24 mg/dL   Creatinine, Ser 0.77 0.57 - 1.00 mg/dL   GFR calc non Af Amer 90 >59 mL/min/1.73   GFR calc Af Amer 104 >59 mL/min/1.73   BUN/Creatinine Ratio 14 9 - 23   Sodium 142 134 - 144 mmol/L   Potassium 4.4 3.5 - 5.2 mmol/L   Chloride 102 96 - 106 mmol/L   CO2 29 20 - 29 mmol/L   Calcium 8.9 8.7 - 10.2 mg/dL   Total Protein 6.6 6.0 - 8.5 g/dL   Albumin 3.8 3.5 - 5.5 g/dL   Globulin, Total 2.8 1.5 - 4.5 g/dL   Albumin/Globulin Ratio 1.4 1.2 - 2.2   Bilirubin Total 0.3 0.0 - 1.2 mg/dL    Alkaline Phosphatase 109 39 - 117 IU/L   AST 10 0 - 40 IU/L   ALT 11 0 - 32 IU/L      Assessment & Plan:   1. Essential hypertension DASH diet  2. Lumbar degenerative disc disease - HYDROcodone-acetaminophen (NORCO) 10-325 MG tablet; Take 1 tablet by mouth every 4 (four) hours as needed.  Dispense: 180 tablet; Refill: 0 - HYDROcodone-acetaminophen (NORCO) 10-325 MG tablet; Take 1 tablet by mouth every 4 (four) hours as needed.  Dispense: 180 tablet; Refill: 0 - HYDROcodone-acetaminophen (NORCO) 10-325 MG tablet; Take 1 tablet by mouth every 4 (four) hours as needed.  Dispense: 180 tablet; Refill: 0  3. Myalgia - HYDROcodone-acetaminophen (NORCO) 10-325 MG tablet; Take 1 tablet by mouth every 4 (four) hours as needed.  Dispense: 180 tablet; Refill: 0 - HYDROcodone-acetaminophen (NORCO) 10-325 MG tablet; Take 1 tablet by mouth every 4 (four) hours as needed.  Dispense: 180 tablet; Refill: 0 - HYDROcodone-acetaminophen (NORCO) 10-325 MG tablet; Take 1 tablet by mouth every 4 (four) hours as needed.  Dispense: 180 tablet; Refill: 0  4. Muscle spasm of both lower legs rest  5. Morbid obesity ([place]) Weight loss  6. Pain of both hip joints   Continue all other maintenance medications as listed above.  Follow up plan: No follow-ups on file.  Educational handout given for [place] PA-C [place], [place]  [address] [phone]   08/01/2018, 8:47 PM

## 2018-08-03 ENCOUNTER — Ambulatory Visit: Payer: Medicaid Other | Admitting: Cardiology

## 2018-08-10 ENCOUNTER — Ambulatory Visit: Payer: Medicaid Other | Admitting: Physician Assistant

## 2018-08-10 ENCOUNTER — Encounter: Payer: Self-pay | Admitting: Physician Assistant

## 2018-08-10 VITALS — BP 151/91 | HR 83 | Temp 97.6°F | Ht 65.0 in | Wt 389.0 lb

## 2018-08-10 DIAGNOSIS — J439 Emphysema, unspecified: Secondary | ICD-10-CM

## 2018-08-10 DIAGNOSIS — J42 Unspecified chronic bronchitis: Secondary | ICD-10-CM | POA: Diagnosis not present

## 2018-08-10 DIAGNOSIS — J40 Bronchitis, not specified as acute or chronic: Secondary | ICD-10-CM | POA: Diagnosis not present

## 2018-08-10 DIAGNOSIS — R0609 Other forms of dyspnea: Secondary | ICD-10-CM

## 2018-08-10 MED ORDER — FLUTICASONE-UMECLIDIN-VILANT 100-62.5-25 MCG/INH IN AEPB: 1.0000 | INHALATION_SPRAY | Freq: Every day | RESPIRATORY_TRACT | 11 refills | Status: DC

## 2018-08-10 MED ORDER — METHYLPREDNISOLONE ACETATE 80 MG/ML IJ SUSP
80.0000 mg | Freq: Once | INTRAMUSCULAR | Status: AC
  Administered 2018-08-10: 80 mg via INTRAMUSCULAR

## 2018-08-10 MED ORDER — CEFDINIR 300 MG PO CAPS: 300.0000 mg | ORAL_CAPSULE | Freq: Two times a day (BID) | ORAL | 0 refills | Status: DC

## 2018-08-12 ENCOUNTER — Telehealth: Payer: Self-pay

## 2018-08-12 NOTE — Telephone Encounter (Signed)
Trelegy non preferred with Medicaid  Preferred are Advair Diskus, Dulera inhaler and Symbicort inhaler

## 2018-08-13 ENCOUNTER — Other Ambulatory Visit: Payer: Self-pay | Admitting: Physician Assistant

## 2018-08-13 MED ORDER — TIOTROPIUM BROMIDE MONOHYDRATE 18 MCG IN CAPS: 18.0000 ug | ORAL_CAPSULE | Freq: Every day | RESPIRATORY_TRACT | 12 refills | Status: DC

## 2018-08-13 MED ORDER — MOMETASONE FURO-FORMOTEROL FUM 200-5 MCG/ACT IN AERO: 2.0000 | INHALATION_SPRAY | Freq: Every day | RESPIRATORY_TRACT | 11 refills | Status: AC

## 2018-08-13 NOTE — Telephone Encounter (Signed)
I sent a prescription of Dulera and Spiriva to the pharmacy.  This covers 3 medicines as the Trelegy has.

## 2018-08-15 DIAGNOSIS — J42 Unspecified chronic bronchitis: Secondary | ICD-10-CM | POA: Insufficient documentation

## 2018-08-15 NOTE — Progress Notes (Signed)
BP (!) 151/91   Pulse 83   Temp 97.6 F (36.4 C) (Oral)   Ht '5\' 5"'  (1.651 m)   Wt (!) 389 lb (176.4 kg)   SpO2 (!) 89% Comment: walking  BMI 64.73 kg/m    Subjective:    Patient ID: Marissa [name], female    DOB: 1966/01/22, 52 y.o.   MRN: [id number]  HPI: Marissa [name] is a 52 y.o. female presenting on 08/10/2018 for Cough and Nasal Congestion  This patient comes in for periodic recheck on medications and conditions including pulmonary emphysema, bronchitis, chronic bronchitis.  She does have pulmonology but has not been in some time she does need to have a referral sent.  I have encouraged to keep working on her smoking cessation.  However at this time she is having a flareup of her bronchitis.  Next month we will have a face-to-face encounter for a mobility chair..   All medications are reviewed today. There are no reports of any problems with the medications. All of the medical conditions are reviewed and updated.  Lab work is reviewed and will be ordered as medically necessary. There are no new problems reported with today's visit.   Past Medical History:  Diagnosis Date  . Arthritis   . Asthma   . Atelectasis pulmonary   . Back pain   . CHF (congestive heart failure) ([place])    11/2016  . Chronic back pain   . COPD (chronic obstructive pulmonary disease) ([place])   . Depression   . Depression with anxiety   . Dyspnea   . Emphysema lung ([place])   . Emphysema lung ([place])   . Fibromyalgia   . GERD (gastroesophageal reflux disease)   . Headache(784.0)    HX MIGRAINES  . Hypercholesterolemia   . Hypertension   . Insomnia   . Morbid obesity ([place])   . Pneumonia    MARCH   2018  . Requires supplemental oxygen    3 LITERS AT HS  . Sleep apnea    BIPAP +  O2   Relevant past medical, surgical, family and social history reviewed and updated as indicated. Interim medical history since our last visit reviewed. Allergies and medications reviewed and updated. DATA REVIEWED: CHART IN  EPIC  Family History reviewed for pertinent findings.  Review of Systems  Constitutional: Positive for chills and fatigue. Negative for activity change, appetite change and fever.  HENT: Positive for congestion, postnasal drip and sore throat.   Eyes: Negative.   Respiratory: Positive for cough, shortness of breath and wheezing.   Cardiovascular: Negative.  Negative for chest pain, palpitations and leg swelling.  Gastrointestinal: Negative.   Genitourinary: Negative.   Musculoskeletal: Negative.   Skin: Negative.   Neurological: Positive for headaches.    Allergies as of 08/10/2018      Reactions   Advera [compleat] Itching   Throat Itching   Relpax [eletriptan] Other (See Comments)   Weakness, couldn't move   Advair Diskus [fluticasone-salmeterol]    Ativan [lorazepam]    Citalopram    Paroxetine Hcl    Sertraline    Morphine And Related Itching      Medication List       Accurate as of August 10, 2018 11:59 PM. Always use your most recent med list.        albuterol 108 (90 Base) MCG/ACT inhaler Commonly known as:  PROVENTIL HFA;VENTOLIN HFA Inhale 2 puffs into the lungs every 4 (four) hours as needed for wheezing or shortness  of breath.   albuterol (2.5 MG/3ML) 0.083% nebulizer solution Commonly known as:  PROVENTIL Take 3 mLs (2.5 mg total) by nebulization every 6 (six) hours as needed for wheezing or shortness of breath.   ALPRAZolam 1 MG tablet Commonly known as:  XANAX Take 1 mg by mouth 3 (three) times daily.   bumetanide 1 MG tablet Commonly known as:  BUMEX Take 1 tablet (1 mg total) by mouth 2 (two) times daily.   cefdinir 300 MG capsule Commonly known as:  OMNICEF Take 1 capsule (300 mg total) by mouth 2 (two) times daily. 1 po BID   Fluticasone-Umeclidin-Vilant 100-62.5-25 MCG/INH Aepb Commonly known as:  TRELEGY ELLIPTA Inhale 1 Inhaler into the lungs daily.   HYDROcodone-acetaminophen 10-325 MG tablet Commonly known as:  NORCO Take 1  tablet by mouth every 4 (four) hours as needed.   HYDROcodone-acetaminophen 10-325 MG tablet Commonly known as:  NORCO Take 1 tablet by mouth every 4 (four) hours as needed.   HYDROcodone-acetaminophen 10-325 MG tablet Commonly known as:  NORCO Take 1 tablet by mouth every 4 (four) hours as needed.   ibuprofen 200 MG tablet Commonly known as:  ADVIL,MOTRIN Take 800 mg by mouth every 8 (eight) hours as needed for mild pain or moderate pain.   multivitamin with minerals tablet Take 1 tablet by mouth daily.   NON FORMULARY Pt uses CPAP machine at bedtime   ondansetron 8 MG disintegrating tablet Commonly known as:  ZOFRAN-ODT Take 1 tablet (8 mg total) by mouth every 8 (eight) hours as needed for nausea or vomiting.   ondansetron 8 MG tablet Commonly known as:  ZOFRAN TAKE 1 TABLET EVERY 8 HOURS AS NEEDED FOR NAUSEA AND VOMITING   OXYGEN Inhale 3 L into the lungs at bedtime.   Potassium 99 MG Tabs Take 1 tablet by mouth as needed.   rOPINIRole 2 MG tablet Commonly known as:  REQUIP Take 1 tablet (2 mg total) by mouth 2 (two) times daily.   VALIUM PO Take by mouth.   Vitamin D (Ergocalciferol) 1.25 MG (50000 UT) Caps capsule Commonly known as:  DRISDOL Take 1 capsule (50,000 Units total) by mouth every 7 (seven) days.   ZYRTEC ALLERGY 10 MG Caps Generic drug:  Cetirizine HCl Take 10 mg by mouth daily.          Objective:    BP (!) 151/91   Pulse 83   Temp 97.6 F (36.4 C) (Oral)   Ht '5\' 5"'  (1.651 m)   Wt (!) 389 lb (176.4 kg)   SpO2 (!) 89% Comment: walking  BMI 64.73 kg/m   Allergies  Allergen Reactions  . Advera [Compleat] Itching    Throat Itching  . Relpax [Eletriptan] Other (See Comments)    Weakness, couldn't move  . Advair Diskus [Fluticasone-Salmeterol]   . Ativan [Lorazepam]   . Citalopram   . Paroxetine Hcl   . Sertraline   . Morphine And Related Itching    Wt Readings from Last 3 Encounters:  08/10/18 (!) 389 lb (176.4 kg)    07/29/18 (!) 385 lb (174.6 kg)  06/30/18 (!) 381 lb 12.8 oz (173.2 kg)    Physical Exam Constitutional:      Appearance: She is well-developed.  HENT:     Head: Normocephalic and atraumatic.     Right Ear: Drainage and tenderness present.     Left Ear: Drainage and tenderness present.     Nose: Mucosal edema and rhinorrhea present.     Right  Sinus: No maxillary sinus tenderness or frontal sinus tenderness.     Left Sinus: No maxillary sinus tenderness or frontal sinus tenderness.     Mouth/Throat:     Pharynx: Oropharyngeal exudate and posterior oropharyngeal erythema present.  Eyes:     Conjunctiva/sclera: Conjunctivae normal.     Pupils: Pupils are equal, round, and reactive to light.  Neck:     Musculoskeletal: Normal range of motion and neck supple.  Cardiovascular:     Rate and Rhythm: Normal rate and regular rhythm.     Heart sounds: Normal heart sounds.  Pulmonary:     Effort: Pulmonary effort is normal.     Breath sounds: Examination of the right-upper field reveals wheezing. Examination of the left-upper field reveals wheezing. Wheezing present.  Abdominal:     General: Bowel sounds are normal.     Palpations: Abdomen is soft.  Skin:    General: Skin is warm and dry.     Findings: No rash.  Neurological:     Mental Status: She is alert and oriented to person, place, and time.     Deep Tendon Reflexes: Reflexes are normal and symmetric.  Psychiatric:        Behavior: Behavior normal.        Thought Content: Thought content normal.        Judgment: Judgment normal.     Results for orders placed or performed in visit on 04/02/18  CBC with Differential/Platelet  Result Value Ref Range   WBC 10.4 3.4 - 10.8 x10E3/uL   RBC 4.42 3.77 - 5.28 x10E6/uL   Hemoglobin 14.1 11.1 - 15.9 g/dL   Hematocrit 44.3 34.0 - 46.6 %   MCV 100 (H) 79 - 97 fL   MCH 31.9 26.6 - 33.0 pg   MCHC 31.8 31.5 - 35.7 g/dL   RDW 14.0 12.3 - 15.4 %   Platelets 235 150 - 450 x10E3/uL    Neutrophils 65 Not Estab. %   Lymphs 24 Not Estab. %   Monocytes 8 Not Estab. %   Eos 2 Not Estab. %   Basos 1 Not Estab. %   Neutrophils Absolute 6.8 1.4 - 7.0 x10E3/uL   Lymphocytes Absolute 2.5 0.7 - 3.1 x10E3/uL   Monocytes Absolute 0.8 0.1 - 0.9 x10E3/uL   EOS (ABSOLUTE) 0.2 0.0 - 0.4 x10E3/uL   Basophils Absolute 0.1 0.0 - 0.2 x10E3/uL   Immature Granulocytes 0 Not Estab. %   Immature Grans (Abs) 0.0 0.0 - 0.1 x10E3/uL  CMP14+EGFR  Result Value Ref Range   Glucose 119 (H) 65 - 99 mg/dL   BUN 11 6 - 24 mg/dL   Creatinine, Ser 0.77 0.57 - 1.00 mg/dL   GFR calc non Af Amer 90 >59 mL/min/1.73   GFR calc Af Amer 104 >59 mL/min/1.73   BUN/Creatinine Ratio 14 9 - 23   Sodium 142 134 - 144 mmol/L   Potassium 4.4 3.5 - 5.2 mmol/L   Chloride 102 96 - 106 mmol/L   CO2 29 20 - 29 mmol/L   Calcium 8.9 8.7 - 10.2 mg/dL   Total Protein 6.6 6.0 - 8.5 g/dL   Albumin 3.8 3.5 - 5.5 g/dL   Globulin, Total 2.8 1.5 - 4.5 g/dL   Albumin/Globulin Ratio 1.4 1.2 - 2.2   Bilirubin Total 0.3 0.0 - 1.2 mg/dL   Alkaline Phosphatase 109 39 - 117 IU/L   AST 10 0 - 40 IU/L   ALT 11 0 - 32 IU/L  Assessment & Plan:   1. Bronchitis - cefdinir (OMNICEF) 300 MG capsule; Take 1 capsule (300 mg total) by mouth 2 (two) times daily. 1 po BID  Dispense: 20 capsule; Refill: 0 - Ambulatory referral to Pulmonology - methylPREDNISolone acetate (DEPO-MEDROL) injection 80 mg  2. Pulmonary emphysema, unspecified emphysema type ([place]) - Ambulatory referral to Pulmonology  3. Dyspnea on exertion - Ambulatory referral to Pulmonology  4. Chronic bronchitis, unspecified chronic bronchitis type ([place]) - Ambulatory referral to Pulmonology   Continue all other maintenance medications as listed above.  Follow up plan: Return in about 4 weeks (around 09/07/2018) for face to face 30 minutes scooter.  Educational handout given for [place] PA-C [place] [address]  [place], [place] [address] [phone]   08/15/2018, 10:05 PM

## 2018-08-27 ENCOUNTER — Ambulatory Visit (INDEPENDENT_AMBULATORY_CARE_PROVIDER_SITE_OTHER)
Admission: RE | Admit: 2018-08-27 | Discharge: 2018-08-27 | Disposition: A | Discharge status: Home / Self Care | Payer: Medicaid Other | Source: Ambulatory Visit | Attending: Nurse Practitioner | Admitting: Nurse Practitioner

## 2018-08-27 ENCOUNTER — Ambulatory Visit: Payer: Medicaid Other | Admitting: Nurse Practitioner

## 2018-08-27 ENCOUNTER — Encounter: Payer: Self-pay | Admitting: Nurse Practitioner

## 2018-08-27 VITALS — BP 152/90 | HR 95 | Ht 65.0 in | Wt 383.0 lb

## 2018-08-27 DIAGNOSIS — J40 Bronchitis, not specified as acute or chronic: Secondary | ICD-10-CM

## 2018-08-27 DIAGNOSIS — R05 Cough: Secondary | ICD-10-CM | POA: Diagnosis not present

## 2018-08-27 DIAGNOSIS — R059 Cough, unspecified: Secondary | ICD-10-CM

## 2018-08-27 MED ORDER — DOXYCYCLINE HYCLATE 100 MG PO TABS: 100.0000 mg | ORAL_TABLET | Freq: Two times a day (BID) | ORAL | 0 refills | Status: DC

## 2018-08-27 MED ORDER — TIOTROPIUM BROMIDE-OLODATEROL 2.5-2.5 MCG/ACT IN AERS: 2.0000 | INHALATION_SPRAY | Freq: Every day | RESPIRATORY_TRACT | 0 refills | Status: DC

## 2018-08-27 MED ORDER — PREDNISONE 10 MG PO TABS: ORAL_TABLET | ORAL | 0 refills | Status: DC

## 2018-08-27 MED ORDER — BENZONATATE 100 MG PO CAPS: 100.0000 mg | ORAL_CAPSULE | Freq: Three times a day (TID) | ORAL | 1 refills | Status: DC

## 2018-08-27 NOTE — Patient Instructions (Addendum)
Will order chest x ray and call with results Will switch inhalers to stiolto 2 puffs daily - samples given - rinse mouth after each use May continue Proventil every 4 hours as needed Will order prednisone taper Will order doxycycline May take mucinex May take delsym Will order tessalon pearls Please quit smoking Follow up with Dr. Isaiah Serge at his 1st available appointment or sooner if needed  Cough:  May take over-the-counter antihistamines and nasal fluticasone to help with allergic rhinitis You need to try to suppress your cough to allow your larynx (voice box) to heal.  For three days don't talk, laugh, sing, or clear your throat. Do everything you can to suppress the cough during this time. Use hard candies (sugarless Jolly Ranchers) or non-mint or non-menthol containing cough drops during this time to soothe your throat.  Use a cough suppressant (Delsym or what I have prescribed you) around the clock during this time.  After three days, gradually increase the use of your voice and back off on the cough suppressants.

## 2018-08-27 NOTE — Assessment & Plan Note (Signed)
Patient has wheezing, congested cough, and rhonchi during exam today. I am concerned for pneumonia. Will check chest x ray. I don't think she is taking deep enough breath to get full dose of inhalers. Will trial stiolto.   Patient Instructions  Will order chest x ray and call with results Will switch inhalers to stiolto 2 puffs daily - samples given - rinse mouth after each use May continue Proventil every 4 hours as needed Will order prednisone taper Will order doxycycline May take mucinex May take delsym Will order tessalon pearls Please quit smoking Follow up with Dr. Isaiah Serge at his 1st available appointment or sooner if needed  Cough:  May take over-the-counter antihistamines and nasal fluticasone to help with allergic rhinitis You need to try to suppress your cough to allow your larynx (voice box) to heal.  For three days don't talk, laugh, sing, or clear your throat. Do everything you can to suppress the cough during this time. Use hard candies (sugarless Jolly Ranchers) or non-mint or non-menthol containing cough drops during this time to soothe your throat.  Use a cough suppressant (Delsym or what I have prescribed you) around the clock during this time.  After three days, gradually increase the use of your voice and back off on the cough suppressants.

## 2018-08-27 NOTE — Progress Notes (Signed)
@Patient  ID: Marissa [name], female    DOB: 13-Apr-1966, 53 y.o.   MRN: [id number]  Chief Complaint  Patient presents with  . Chest congestion    Referring provider: Remus Loffler, PA-C  HPI  53 year old female smoker with COPD by Dr. Isaiah Serge  Tests: CT chest 11/11/16>>no cardiopulmonary disease Echo 10/2016 echo LVEF 60-65%, normal diastolic function, normal RV A1At 01/27/17- 158, PIMM CBC 6/5/1 8-WBC 10.3, basophils 1.2 percent, absolute eosinophil count 100 Blood allergic profile 6/5/1 8-sensitive to multiple allergens, IgE 520  PFT Results Latest Ref Rng & Units 04/24/2017  FVC-Pre L 2.37  FVC-Predicted Pre % 64  FVC-Post L 2.67  FVC-Predicted Post % 72  Pre FEV1/FVC % % 68  Post FEV1/FCV % % 70  FEV1-Pre L 1.62  FEV1-Predicted Pre % 55  FEV1-Post L 1.86  DLCO UNC% % 87  DLCO COR %Predicted % 94  TLC L 5.58  TLC % Predicted % 107  RV % Predicted % 162     OV 08/27/18 - cough/shortness of breath Patient presents for cough and shortness of breath.  She states that started around 1 month ago.  Patient was seen by her PCP and was placed on Omnicef and given a steroid shot in the office at that time.  She states that symptoms did not improve.  She still has ongoing cough that is nonproductive.  He also complains of sinus congestion.  Is any fever.  Her PCP also changed her inhalers to Spiriva HandiHaler and Dulera.  She states the inhalers are not working for her.  She does not feel like she is getting a deep enough breath for the Spiriva to be effective.  He was last seen here by Dr. Isaiah Serge on 04/24/2017.  Patient is still smoking but states she has cut back.  She denies any fever, chest pain, or edema.     Allergies  Allergen Reactions  . Advera [Compleat] Itching    Throat Itching  . Relpax [Eletriptan] Other (See Comments)    Weakness, couldn't move  . Advair Diskus [Fluticasone-Salmeterol]     Made throat feel itchy.  . Ativan [Lorazepam]   . Citalopram   . Paroxetine  Hcl   . Sertraline   . Morphine And Related Itching     There is no immunization history on file for this patient.  Past Medical History:  Diagnosis Date  . Arthritis   . Asthma   . Atelectasis pulmonary   . Back pain   . CHF (congestive heart failure) (HCC)    11/2016  . Chronic back pain   . COPD (chronic obstructive pulmonary disease) (HCC)   . Depression   . Depression with anxiety   . Dyspnea   . Emphysema lung (HCC)   . Emphysema lung (HCC)   . Fibromyalgia   . GERD (gastroesophageal reflux disease)   . Headache(784.0)    HX MIGRAINES  . Hypercholesterolemia   . Hypertension   . Insomnia   . Morbid obesity (HCC)   . Pneumonia    MARCH   2018  . Requires supplemental oxygen    3 LITERS AT HS  . Sleep apnea    BIPAP +  O2    Tobacco History: Social History   Tobacco Use  Smoking Status Current Every Day Smoker  . Packs/day: 0.25  . Years: 35.00  . Pack years: 8.75  . Types: Cigarettes  Smokeless Tobacco Never Used  Tobacco Comment   1/2 pack per day  Ready to quit: No Counseling given: Yes Comment: 1/2 pack per day    Outpatient Encounter Medications as of 08/27/2018  Medication Sig  . albuterol (PROVENTIL HFA;VENTOLIN HFA) 108 (90 BASE) MCG/ACT inhaler Inhale 2 puffs into the lungs every 4 (four) hours as needed for wheezing or shortness of breath.  Marland Kitchen. albuterol (PROVENTIL) (2.5 MG/3ML) 0.083% nebulizer solution Take 3 mLs (2.5 mg total) by nebulization every 6 (six) hours as needed for wheezing or shortness of breath.  . ALPRAZolam (XANAX) 1 MG tablet Take 1 mg by mouth 3 (three) times daily.   . bumetanide (BUMEX) 1 MG tablet Take 1 tablet (1 mg total) by mouth 2 (two) times daily.  . cefdinir (OMNICEF) 300 MG capsule Take 1 capsule (300 mg total) by mouth 2 (two) times daily. 1 po BID  . Cetirizine HCl (ZYRTEC ALLERGY) 10 MG CAPS Take 10 mg by mouth daily.   . diazePAM (VALIUM PO) Take by mouth.  Marland Kitchen. HYDROcodone-acetaminophen (NORCO) 10-325 MG  tablet Take 1 tablet by mouth every 4 (four) hours as needed.  Marland Kitchen. ibuprofen (ADVIL,MOTRIN) 200 MG tablet Take 800 mg by mouth every 8 (eight) hours as needed for mild pain or moderate pain.  . mometasone-formoterol (DULERA) 200-5 MCG/ACT AERO Inhale 2 puffs into the lungs daily.  . Multiple Vitamins-Minerals (MULTIVITAMIN WITH MINERALS) tablet Take 1 tablet by mouth daily.  . NON FORMULARY Pt uses CPAP machine at bedtime  . ondansetron (ZOFRAN) 8 MG tablet TAKE 1 TABLET EVERY 8 HOURS AS NEEDED FOR NAUSEA AND VOMITING  . OXYGEN Inhale 3 L into the lungs at bedtime.  . Potassium 99 MG TABS Take 1 tablet by mouth as needed.   Marland Kitchen. rOPINIRole (REQUIP) 2 MG tablet Take 1 tablet (2 mg total) by mouth 2 (two) times daily.  Marland Kitchen. tiotropium (SPIRIVA HANDIHALER) 18 MCG inhalation capsule Place 1 capsule (18 mcg total) into inhaler and inhale daily.  . Vitamin D, Ergocalciferol, (DRISDOL) 50000 units CAPS capsule Take 1 capsule (50,000 Units total) by mouth every 7 (seven) days.  . benzonatate (TESSALON) 100 MG capsule Take 1 capsule (100 mg total) by mouth 3 (three) times daily.  Marland Kitchen. doxycycline (VIBRA-TABS) 100 MG tablet Take 1 tablet (100 mg total) by mouth 2 (two) times daily.  . predniSONE (DELTASONE) 10 MG tablet Take 4 tabs for 2 days, then 3 tabs for 2 days, then 2 tabs for 2 days, then 1 tab for 2 days, then stop  . Tiotropium Bromide-Olodaterol (STIOLTO RESPIMAT) 2.5-2.5 MCG/ACT AERS Inhale 2 puffs into the lungs daily.  . [DISCONTINUED] HYDROcodone-acetaminophen (NORCO) 10-325 MG tablet Take 1 tablet by mouth every 4 (four) hours as needed. (Patient not taking: Reported on 08/27/2018)  . [DISCONTINUED] HYDROcodone-acetaminophen (NORCO) 10-325 MG tablet Take 1 tablet by mouth every 4 (four) hours as needed. (Patient not taking: Reported on 08/27/2018)  . [DISCONTINUED] ondansetron (ZOFRAN-ODT) 8 MG disintegrating tablet Take 1 tablet (8 mg total) by mouth every 8 (eight) hours as needed for nausea or vomiting.  (Patient not taking: Reported on 08/27/2018)   No facility-administered encounter medications on file as of 08/27/2018.      Review of Systems  Review of Systems  Constitutional: Positive for chills and fever.  HENT: Positive for congestion.   Respiratory: Positive for cough, shortness of breath and wheezing.   Cardiovascular: Negative.  Negative for chest pain, palpitations and leg swelling.  Gastrointestinal: Negative.   Allergic/Immunologic: Negative.   Neurological: Negative.   Psychiatric/Behavioral: Negative.  Physical Exam  BP (!) 152/90 (BP Location: Left Arm, Patient Position: Sitting, Cuff Size: Large)   Pulse 95   Ht 5\' 5"  (1.651 m)   Wt (!) 383 lb (173.7 kg)   SpO2 90%   BMI 63.73 kg/m   Wt Readings from Last 5 Encounters:  08/27/18 (!) 383 lb (173.7 kg)  08/10/18 (!) 389 lb (176.4 kg)  07/29/18 (!) 385 lb (174.6 kg)  06/30/18 (!) 381 lb 12.8 oz (173.2 kg)  04/28/18 (!) 387 lb 6.4 oz (175.7 kg)     Physical Exam Vitals signs and nursing note reviewed.  Constitutional:      General: She is not in acute distress.    Appearance: She is well-developed. She is ill-appearing.  Cardiovascular:     Rate and Rhythm: Normal rate and regular rhythm.  Pulmonary:     Effort: Pulmonary effort is normal. No respiratory distress.     Breath sounds: Wheezing and rhonchi present.  Neurological:     Mental Status: She is alert and oriented to person, place, and time.      Imaging: Dg Chest 2 View  Result Date: 08/27/2018 CLINICAL DATA:  53 year old female with a history of cough chest pressure and shortness of breath EXAM: CHEST - 2 VIEW COMPARISON:  05/28/2018, 12/18/2016, 12/14/2016 FINDINGS: Cardiomediastinal silhouette unchanged in size and contour. No evidence of central vascular congestion Mild reticulonodular pattern of opacity which is more pronounced at the lung bases. No pleural effusion or pneumothorax. No confluent airspace disease. IMPRESSION:  Reticulonodular pattern of opacity may reflect chronic changes or potentially acute atypical pneumonia and/or bronchitis. Electronically Signed   By: Gilmer Mor D.O.   On: 08/27/2018 12:37     Assessment & Plan:   Bronchitis Patient has wheezing, congested cough, and rhonchi during exam today. I am concerned for pneumonia. Will check chest x ray. I don't think she is taking deep enough breath to get full dose of inhalers. Will trial stiolto.   Patient Instructions  Will order chest x ray and call with results Will switch inhalers to stiolto 2 puffs daily - samples given - rinse mouth after each use May continue Proventil every 4 hours as needed Will order prednisone taper Will order doxycycline May take mucinex May take delsym Will order tessalon pearls Please quit smoking Follow up with Dr. Isaiah Serge at his 1st available appointment or sooner if needed  Cough:  May take over-the-counter antihistamines and nasal fluticasone to help with allergic rhinitis You need to try to suppress your cough to allow your larynx (voice box) to heal.  For three days don't talk, laugh, sing, or clear your throat. Do everything you can to suppress the cough during this time. Use hard candies (sugarless Jolly Ranchers) or non-mint or non-menthol containing cough drops during this time to soothe your throat.  Use a cough suppressant (Delsym or what I have prescribed you) around the clock during this time.  After three days, gradually increase the use of your voice and back off on the cough suppressants.        Ivonne Andrew, NP 08/27/2018

## 2018-09-06 ENCOUNTER — Ambulatory Visit: Payer: Medicaid Other | Admitting: Cardiology

## 2018-09-09 ENCOUNTER — Ambulatory Visit: Payer: Medicaid Other | Admitting: Cardiology

## 2018-09-09 NOTE — Progress Notes (Deleted)
Clinical Summary Marissa [name] is a 53 y.o.female  seen today for follow up of the following medical problems.   1. Chest pain - chest pain started about 2-3 weeks. Worst with breathing, midchest. Severe pain. No other associated symptoms.  - can be worst with walking up stairs at her apartment. No relation to food. - admit to [facility] 10/2016 withsymptoms. - CT PE negative.  -10/2016 echo LVEF 60-65%, normal diastolic function, normal RV  - denies any recent symptoms.    2. Palpitations  - no significant symptoms since last visit.   3. LE edema 10/2016 echo LVEF 60-65%, normal diastolic function, normal atria - back in 11/2016 she was started on lasix 20mg  prn swelling  - on bumex, looks to have been started some point after our visit 11/2016. Prior 2017 echo reported reduced diastolic function. - bumex started at pcp office about 1-2 months ago  - Increased swelling, cyclical with her period. Diffuse swelling, worst knees down. Can get some SOB associated.  - DOE with activities. Sleeps in recliner due to back pain. Bumex urinates 4 hours, then tapers off. Limiting salt intake. TSH 2.48  - 02/2018 labs BNP 43 - this is worst swelling has ever been.  - can have some nonspecific chest pain midchest to midback. Sharp pain. Not positioan. Lasts about 15 minutes. Often comes on with coughing.    03/2018 echo LVEF 50%, normal diastolic function, normal RV   4. OSA  - wears bipap at night, followed by pulmonary.    5. COPD  - per pcp and pulmonary  6. Obesity - she reports she has been referred to bariatric surgery.    Past Medical History:  Diagnosis Date  . Arthritis   . Asthma   . Atelectasis pulmonary   . Back pain   . CHF (congestive heart failure) (HCC)    11/2016  . Chronic back pain   . COPD (chronic obstructive pulmonary disease) (HCC)   . Depression   . Depression with anxiety   . Dyspnea   . Emphysema lung (HCC)   . Emphysema lung (HCC)   .  Fibromyalgia   . GERD (gastroesophageal reflux disease)   . Headache(784.0)    HX MIGRAINES  . Hypercholesterolemia   . Hypertension   . Insomnia   . Morbid obesity (HCC)   . Pneumonia    MARCH   2018  . Requires supplemental oxygen    3 LITERS AT HS  . Sleep apnea    BIPAP +  O2     Allergies  Allergen Reactions  . Advera [Compleat] Itching    Throat Itching  . Relpax [Eletriptan] Other (See Comments)    Weakness, couldn't move  . Advair Diskus [Fluticasone-Salmeterol]     Made throat feel itchy.  . Ativan [Lorazepam]   . Citalopram   . Paroxetine Hcl   . Sertraline   . Morphine And Related Itching     Current Outpatient Medications  Medication Sig Dispense Refill  . albuterol (PROVENTIL HFA;VENTOLIN HFA) 108 (90 BASE) MCG/ACT inhaler Inhale 2 puffs into the lungs every 4 (four) hours as needed for wheezing or shortness of breath. 1 Inhaler 2  . albuterol (PROVENTIL) (2.5 MG/3ML) 0.083% nebulizer solution Take 3 mLs (2.5 mg total) by nebulization every 6 (six) hours as needed for wheezing or shortness of breath. 240 mL 11  . ALPRAZolam (XANAX) 1 MG tablet Take 1 mg by mouth 3 (three) times daily.     Marland Kitchen  benzonatate (TESSALON) 100 MG capsule Take 1 capsule (100 mg total) by mouth 3 (three) times daily. 30 capsule 1  . bumetanide (BUMEX) 1 MG tablet Take 1 tablet (1 mg total) by mouth 2 (two) times daily. 60 tablet 1  . cefdinir (OMNICEF) 300 MG capsule Take 1 capsule (300 mg total) by mouth 2 (two) times daily. 1 po BID 20 capsule 0  . Cetirizine HCl (ZYRTEC ALLERGY) 10 MG CAPS Take 10 mg by mouth daily.     . diazePAM (VALIUM PO) Take by mouth.    . doxycycline (VIBRA-TABS) 100 MG tablet Take 1 tablet (100 mg total) by mouth 2 (two) times daily. 14 tablet 0  . HYDROcodone-acetaminophen (NORCO) 10-325 MG tablet Take 1 tablet by mouth every 4 (four) hours as needed. 180 tablet 0  . ibuprofen (ADVIL,MOTRIN) 200 MG tablet Take 800 mg by mouth every 8 (eight) hours as needed  for mild pain or moderate pain.    . mometasone-formoterol (DULERA) 200-5 MCG/ACT AERO Inhale 2 puffs into the lungs daily. 13 g 11  . Multiple Vitamins-Minerals (MULTIVITAMIN WITH MINERALS) tablet Take 1 tablet by mouth daily.    . NON FORMULARY Pt uses CPAP machine at bedtime    . ondansetron (ZOFRAN) 8 MG tablet TAKE 1 TABLET EVERY 8 HOURS AS NEEDED FOR NAUSEA AND VOMITING 20 tablet 2  . OXYGEN Inhale 3 L into the lungs at bedtime.    . Potassium 99 MG TABS Take 1 tablet by mouth as needed.     . predniSONE (DELTASONE) 10 MG tablet Take 4 tabs for 2 days, then 3 tabs for 2 days, then 2 tabs for 2 days, then 1 tab for 2 days, then stop 20 tablet 0  . rOPINIRole (REQUIP) 2 MG tablet Take 1 tablet (2 mg total) by mouth 2 (two) times daily. 120 tablet 5  . tiotropium (SPIRIVA HANDIHALER) 18 MCG inhalation capsule Place 1 capsule (18 mcg total) into inhaler and inhale daily. 30 capsule 12  . Tiotropium Bromide-Olodaterol (STIOLTO RESPIMAT) 2.5-2.5 MCG/ACT AERS Inhale 2 puffs into the lungs daily. 2 Inhaler 0  . Vitamin D, Ergocalciferol, (DRISDOL) 50000 units CAPS capsule Take 1 capsule (50,000 Units total) by mouth every 7 (seven) days. 4 capsule 11   No current facility-administered medications for this visit.      Past Surgical History:  Procedure Laterality Date  . CESAREAN SECTION    . CHOLECYSTECTOMY    . FOOT SURGERY    . GALLBLADDER SURGERY    . TONSILLECTOMY    . TOOTH EXTRACTION N/A 01/30/2017   Procedure: EXTRACTION TEETH 15, 17, 31;  Surgeon: Ocie DoyneJensen, Scott, DDS;  Location: [facility] OR;  Service: Oral Surgery;  Laterality: N/A;     Allergies  Allergen Reactions  . Advera [Compleat] Itching    Throat Itching  . Relpax [Eletriptan] Other (See Comments)    Weakness, couldn't move  . Advair Diskus [Fluticasone-Salmeterol]     Made throat feel itchy.  . Ativan [Lorazepam]   . Citalopram   . Paroxetine Hcl   . Sertraline   . Morphine And Related Itching      Family History    Problem Relation Age of Onset  . Congestive Heart Failure Mother   . Hypertension Mother   . Alcoholism Father      Social History Ms. Mao reports that she has been smoking cigarettes. She has a 8.75 pack-year smoking history. She has never used smokeless tobacco. Ms. Laney Pastorart reports no history of alcohol  use.   Review of Systems CONSTITUTIONAL: No weight loss, fever, chills, weakness or fatigue.  HEENT: Eyes: No visual loss, blurred vision, double vision or yellow sclerae.No hearing loss, sneezing, congestion, runny nose or sore throat.  SKIN: No rash or itching.  CARDIOVASCULAR:  RESPIRATORY: No shortness of breath, cough or sputum.  GASTROINTESTINAL: No anorexia, nausea, vomiting or diarrhea. No abdominal pain or blood.  GENITOURINARY: No burning on urination, no polyuria NEUROLOGICAL: No headache, dizziness, syncope, paralysis, ataxia, numbness or tingling in the extremities. No change in bowel or bladder control.  MUSCULOSKELETAL: No muscle, back pain, joint pain or stiffness.  LYMPHATICS: No enlarged nodes. No history of splenectomy.  PSYCHIATRIC: No history of depression or anxiety.  ENDOCRINOLOGIC: No reports of sweating, cold or heat intolerance. No polyuria or polydipsia.  Marland Kitchen.   Physical Examination There were no vitals filed for this visit. There were no vitals filed for this visit.  Gen: resting comfortably, no acute distress HEENT: no scleral icterus, pupils equal round and reactive, no palptable cervical adenopathy,  CV Resp: Clear to auscultation bilaterally GI: abdomen is soft, non-tender, non-distended, normal bowel sounds, no hepatosplenomegaly MSK: extremities are warm, no edema.  Skin: warm, no rash Neuro:  no focal deficits Psych: appropriate affect   Diagnostic Studies 03/2018 echo Study Conclusions  - Left ventricle: Images are limited overall. The cavity size was   normal. Wall thickness was increased in a pattern of mild LVH.   Systolic function  was normal. The estimated ejection fraction was   50%. Images were inadequate for LV wall motion assessment. Left   ventricular diastolic function parameters were normal. - Aortic valve: Poorly visualized. - Mitral valve: There was trivial regurgitation. - Right atrium: Central venous pressure (est): 3 mm Hg. - Atrial septum: No defect or patent foramen ovale was identified. - Tricuspid valve: There was trivial regurgitation. - Pulmonary arteries: Systolic pressure could not be accurately   estimated. - Pericardium, extracardiac: A prominent pericardial fat pad was   present.    Assessment and Plan   1. Chest pain - atypical chest pain in the past, no recent symptoms - continue to monitor.   2. Palpitations - primarily brought on with stress - continue to monitor at this time - EKG today shows normal sinus rhythm  3. LE edema - repeat echo - change bumex to 1mg  bid. Recheck BMET/Mg - if normal echo, or fails to respond to diuretics consider alternative causes of her edema such as severe obesity, venous insufficiecny. Exam not quite consistent with lymphedema but also on the differential.    F/u 6 weeks     Antoine PocheJonathan F. Raziah Funnell, M.D., F.A.C.C.

## 2018-09-10 ENCOUNTER — Ambulatory Visit: Payer: Medicaid Other | Admitting: Physician Assistant

## 2018-09-16 ENCOUNTER — Other Ambulatory Visit: Payer: Self-pay | Admitting: Physician Assistant

## 2018-09-22 ENCOUNTER — Ambulatory Visit: Payer: Medicaid Other | Admitting: Family Medicine

## 2018-09-28 ENCOUNTER — Ambulatory Visit: Payer: Medicaid Other | Admitting: Physician Assistant

## 2018-09-30 ENCOUNTER — Ambulatory Visit: Payer: Medicaid Other | Admitting: Physician Assistant

## 2018-10-04 ENCOUNTER — Ambulatory Visit: Payer: Medicaid Other | Admitting: Pulmonary Disease

## 2018-10-05 ENCOUNTER — Ambulatory Visit: Payer: Medicaid Other | Admitting: Physician Assistant

## 2018-10-08 ENCOUNTER — Ambulatory Visit: Payer: Medicaid Other | Admitting: Physician Assistant

## 2018-10-12 ENCOUNTER — Encounter: Payer: Self-pay | Admitting: Physician Assistant

## 2018-10-18 ENCOUNTER — Encounter: Payer: Self-pay | Admitting: Physician Assistant

## 2018-10-18 ENCOUNTER — Ambulatory Visit (INDEPENDENT_AMBULATORY_CARE_PROVIDER_SITE_OTHER): Payer: Medicaid Other | Admitting: Physician Assistant

## 2018-10-18 ENCOUNTER — Other Ambulatory Visit: Payer: Self-pay | Admitting: Physician Assistant

## 2018-10-18 ENCOUNTER — Telehealth: Payer: Self-pay | Admitting: Physician Assistant

## 2018-10-18 VITALS — BP 131/85 | HR 78 | Temp 97.5°F | Ht 65.0 in | Wt 390.8 lb

## 2018-10-18 DIAGNOSIS — M5136 Other intervertebral disc degeneration, lumbar region: Secondary | ICD-10-CM

## 2018-10-18 DIAGNOSIS — G8929 Other chronic pain: Secondary | ICD-10-CM

## 2018-10-18 DIAGNOSIS — G5603 Carpal tunnel syndrome, bilateral upper limbs: Secondary | ICD-10-CM

## 2018-10-18 DIAGNOSIS — R0689 Other abnormalities of breathing: Secondary | ICD-10-CM

## 2018-10-18 DIAGNOSIS — M51369 Other intervertebral disc degeneration, lumbar region without mention of lumbar back pain or lower extremity pain: Secondary | ICD-10-CM

## 2018-10-18 DIAGNOSIS — M25511 Pain in right shoulder: Secondary | ICD-10-CM

## 2018-10-18 DIAGNOSIS — I509 Heart failure, unspecified: Secondary | ICD-10-CM

## 2018-10-18 MED ORDER — OXYCODONE-ACETAMINOPHEN 10-325 MG PO TABS: 1.0000 | ORAL_TABLET | Freq: Four times a day (QID) | ORAL | 0 refills | Status: DC | PRN

## 2018-10-18 MED ORDER — OXYCODONE-ACETAMINOPHEN 10-325 MG PO TABS: 1.0000 | ORAL_TABLET | Freq: Three times a day (TID) | ORAL | 0 refills | Status: DC | PRN

## 2018-10-18 NOTE — Telephone Encounter (Signed)
Prescription for 1 week supply has been sent to her pharmacy.

## 2018-10-18 NOTE — Telephone Encounter (Signed)
PT was written a rx for oxyCODONE-acetaminophen (PERCOCET) 10-325 MG tablet today and it is needing a prior auth, pt states that the pharmacy is telling her that she will lose her script if they were to give her a few days worth until we get the prior auth completed. The pt is not sure what she needs to do.

## 2018-10-19 DIAGNOSIS — R0689 Other abnormalities of breathing: Secondary | ICD-10-CM | POA: Insufficient documentation

## 2018-10-19 DIAGNOSIS — G8929 Other chronic pain: Secondary | ICD-10-CM | POA: Insufficient documentation

## 2018-10-19 NOTE — Progress Notes (Signed)
61  MME 6 LME    BP 131/85   Pulse 78   Temp (!) 97.5 F (36.4 C) (Oral)   Ht 5\' 5"  (1.651 m)   Wt (!) 390 lb 12.8 oz (177.3 kg)   BMI 65.03 kg/m    Subjective:    Patient ID: Marissa [name], female    DOB: [birth year], 53 y.o.   MRN: [id number]  HPI: Marissa [name] is a 53 y.o. female presenting on 10/18/2018 for Face to Face (scooter ) and Medication Refill  This is a FACE TO FACE encounter for POWER WHEELCHAIR.  She has multiple longterm chronic and permanent disabilities to qualify her for a chair.  She has hypoventilation syndrome and therefore gets very hypoxic after walking for very far, she also has carpal tunnel in both hands she is not been able to have that repaired.  She also deals with chronic pain in her low back due to degenerative disc disease and different osteoarthritis is.  She has chronic congestive heart failure.  She also has a bad right shoulder with very limited range of motion.  All of these interfere with her ability to perform activities of daily living by giving her an abnormal gait, deconditioning, edema, fatigue, pain, shortness of breath, weakness.  She does experience shortness of breath, she does require oxygen and uses 3 L.  Small on room air she has gone down to 85 but generally can say at 94 with a long oxygen.  She does not have any current pressure sores and is never had any in the past yet.  She is able to shift weight, she does have poor balance, poor endurance, history of falls, does have a risk of falls and does have significant edema.  She does have severe weakness in her upper body and lower body she also has upper body moderate pain and severe pain in the lower body.  At this time she uses a walker for maximum assistance while she is walking.  And she does have a shuffling gait.  She does need help with her activities of daily living including feeding, bathing, grooming, moving from bathroom, dressing, toileting.  She has to use a walker but this is  exhausting her and her breathing efforts that when she gets somewhere she cannot perform activity.  Also her pain is not stable enough anymore.  She does not have enough strength and stamina to use manual wheelchair.  She does have a harder time getting herself up from her bed in chair she has to strain so much that she is exhausted by the time she gets up.  I would like for her to possibly look in to getting a lift chair.  We will send her order for this also.  She does need some elevated red leg rests in a reclining back if possible.  She does have physical and mental abilities to operate a power wheelchair.  She is motivated to use it in her home.  Her prognosis for all of this is poor.  We will send an order to [facility] to order both a mobilized chair and lift chair. Her new address is [address], [place], [place] [address].  She states that staff is more open and able to handle the chair.  Her previous home had very tight the worse.  This patient returns for a 3 month recheck on narcotic use for chronic pain from above and medication refills  Patient currently taking hydrocodone. Behavior- normal Medication side effects- no  Any concerns- no  PMP AWARE website reviewed: Yes Any suspicious activity on PMP Aware: No MME daily dose: 60 Contract on file Last UDS  10/18/2018   She is still also under the care of Dr. Betti Cruz for psychiatric conditions and she is encouraged to continue there.  Past Medical History:  Diagnosis Date  . Arthritis   . Asthma   . Atelectasis pulmonary   . Back pain   . CHF (congestive heart failure) (HCC)    11/2016  . Chronic back pain   . COPD (chronic obstructive pulmonary disease) (HCC)   . Depression   . Depression with anxiety   . Dyspnea   . Emphysema lung (HCC)   . Emphysema lung (HCC)   . Fibromyalgia   . GERD (gastroesophageal reflux disease)   . Headache(784.0)    HX MIGRAINES  . Hypercholesterolemia   . Hypertension   . Insomnia   .  Morbid obesity (HCC)   . Pneumonia    MARCH   2018  . Requires supplemental oxygen    3 LITERS AT HS  . Sleep apnea    BIPAP +  O2   Relevant past medical, surgical, family and social history reviewed and updated as indicated. Interim medical history since our last visit reviewed. Allergies and medications reviewed and updated. DATA REVIEWED: CHART IN EPIC  Family History reviewed for pertinent findings.  Review of Systems  Constitutional: Positive for fatigue. Negative for activity change and fever.  HENT: Negative.   Eyes: Negative.   Respiratory: Positive for cough and shortness of breath.   Cardiovascular: Positive for leg swelling. Negative for chest pain and palpitations.  Gastrointestinal: Negative.  Negative for abdominal pain.  Endocrine: Negative.   Genitourinary: Negative.  Negative for dysuria.  Musculoskeletal: Positive for arthralgias, back pain, myalgias and neck pain.  Skin: Negative.   Neurological: Negative.   Psychiatric/Behavioral: Positive for decreased concentration. The patient is nervous/anxious.     Allergies as of 10/18/2018      Reactions   Advera [compleat] Itching   Throat Itching   Relpax [eletriptan] Other (See Comments)   Weakness, couldn't move   Advair Diskus [fluticasone-salmeterol]    Made throat feel itchy.   Ativan [lorazepam]    Citalopram    Paroxetine Hcl    Sertraline    Morphine And Related Itching      Medication List       Accurate as of October 18, 2018 11:59 PM. Always use your most recent med list.        albuterol 108 (90 Base) MCG/ACT inhaler Commonly known as:  PROVENTIL HFA;VENTOLIN HFA Inhale 2 puffs into the lungs every 4 (four) hours as needed for wheezing or shortness of breath.   albuterol (2.5 MG/3ML) 0.083% nebulizer solution Commonly known as:  PROVENTIL Take 3 mLs (2.5 mg total) by nebulization every 6 (six) hours as needed for wheezing or shortness of breath.   ALPRAZolam 1 MG tablet Commonly  known as:  XANAX Take 1 mg by mouth 3 (three) times daily.   benzonatate 100 MG capsule Commonly known as:  TESSALON Take 1 capsule (100 mg total) by mouth 3 (three) times daily.   bumetanide 1 MG tablet Commonly known as:  BUMEX Take 1 tablet (1 mg total) by mouth 2 (two) times daily.   cefdinir 300 MG capsule Commonly known as:  OMNICEF Take 1 capsule (300 mg total) by mouth 2 (two) times daily. 1 po BID   ibuprofen 200 MG tablet  Commonly known as:  ADVIL,MOTRIN Take 800 mg by mouth every 8 (eight) hours as needed for mild pain or moderate pain.   mometasone-formoterol 200-5 MCG/ACT Aero Commonly known as:  DULERA Inhale 2 puffs into the lungs daily.   multivitamin with minerals tablet Take 1 tablet by mouth daily.   NON FORMULARY Pt uses CPAP machine at bedtime   ondansetron 8 MG tablet Commonly known as:  ZOFRAN TAKE 1 TABLET EVERY 8 HOURS AS NEEDED FOR NAUSEA AND VOMITING   oxyCODONE-acetaminophen 10-325 MG tablet Commonly known as:  PERCOCET Take 1 tablet by mouth every 6 (six) hours as needed for pain.   oxyCODONE-acetaminophen 10-325 MG tablet Commonly known as:  PERCOCET Take 1 tablet by mouth every 8 (eight) hours as needed for pain.   OXYGEN Inhale 3 L into the lungs at bedtime.   Potassium 99 MG Tabs Take 1 tablet by mouth as needed.   predniSONE 10 MG tablet Commonly known as:  DELTASONE Take 4 tabs for 2 days, then 3 tabs for 2 days, then 2 tabs for 2 days, then 1 tab for 2 days, then stop   rOPINIRole 2 MG tablet Commonly known as:  REQUIP Take 1 tablet (2 mg total) by mouth 2 (two) times daily.   tiotropium 18 MCG inhalation capsule Commonly known as:  SPIRIVA HANDIHALER Place 1 capsule (18 mcg total) into inhaler and inhale daily.   Tiotropium Bromide-Olodaterol 2.5-2.5 MCG/ACT Aers Commonly known as:  STIOLTO RESPIMAT Inhale 2 puffs into the lungs daily.   Vitamin D (Ergocalciferol) 1.25 MG (50000 UT) Caps capsule Commonly known as:   DRISDOL Take 1 capsule (50,000 Units total) by mouth every 7 (seven) days.   ZYRTEC ALLERGY 10 MG Caps Generic drug:  Cetirizine HCl Take 10 mg by mouth daily.   cetirizine 10 MG tablet Commonly known as:  ZYRTEC TAKE 1 TABLET DAILY AS NEEDED FOR ALLERGIES          Objective:    BP 131/85   Pulse 78   Temp (!) 97.5 F (36.4 C) (Oral)   Ht  (1.651 m)   Wt (!) 390 lb 12.8 oz (177.3 kg)   BMI 65.03 kg/m   Allergies  Allergen Reactions  . Advera [Compleat] Itching    Throat Itching  . Relpax [Eletriptan] Other (See Comments)    Weakness, couldn't move  . Advair Diskus [Fluticasone-Salmeterol]     Made throat feel itchy.  . Ativan [Lorazepam]   . Citalopram   . Paroxetine Hcl   . Sertraline   . Morphine And Related Itching    Wt Readings from Last 3 Encounters:  10/18/18 (!) 390 lb 12.8 oz (177.3 kg)  08/27/18 (!) 383 lb (173.7 kg)  08/10/18 (!) 389 lb (176.4 kg)    Physical Exam Constitutional:      Appearance: She is well-developed. She is obese. She is not ill-appearing.  HENT:     Head: Normocephalic and atraumatic.     Right Ear: Tympanic membrane, ear canal and external ear normal.     Left Ear: Tympanic membrane, ear canal and external ear normal.     Nose: Nose normal. No rhinorrhea.     Mouth/Throat:     Pharynx: No oropharyngeal exudate or posterior oropharyngeal erythema.  Eyes:     Conjunctiva/sclera: Conjunctivae normal.     Pupils: Pupils are equal, round, and reactive to light.  Neck:     Musculoskeletal: Normal range of motion and neck supple.  Cardiovascular:  Rate and Rhythm: Normal rate and regular rhythm.     Heart sounds: Normal heart sounds.  Pulmonary:     Effort: Pulmonary effort is normal.     Breath sounds: Wheezing present.  Abdominal:     General: Bowel sounds are normal.     Palpations: Abdomen is soft.  Musculoskeletal:     Left shoulder: She exhibits decreased range of motion and tenderness.     Right hip: She  exhibits decreased range of motion and decreased strength.     Left hip: She exhibits decreased range of motion and decreased strength.     Right knee: She exhibits decreased range of motion and swelling.     Left knee: She exhibits decreased range of motion and swelling.     Lumbar back: She exhibits decreased range of motion, tenderness, pain and spasm.       Back:     Right lower leg: Edema present.     Left lower leg: Edema present.  Skin:    General: Skin is warm and dry.     Findings: No rash.  Neurological:     Mental Status: She is alert and oriented to person, place, and time.     Motor: Weakness present.     Deep Tendon Reflexes: Reflexes are normal and symmetric.     Comments: Weakness in left shoulder and both legs  Psychiatric:        Behavior: Behavior normal.        Thought Content: Thought content normal.        Judgment: Judgment normal.         Assessment & Plan:   1. Hypoventilation syndrome Mobile chair is ordered  2. Carpal tunnel syndrome on both sides Mobile chair is ordered  3. Other chronic pain Mobile chair is ordered - oxyCODONE-acetaminophen (PERCOCET) 10-325 MG tablet; Take 1 tablet by mouth every 6 (six) hours as needed for pain.  Dispense: 120 tablet; Refill: 0 - ToxASSURE Select 13 (MW), Urine  4. Chronic congestive heart failure, unspecified heart failure type [facility]) Mobile chair is ordered  5. Lumbar degenerative disc disease Mobile chair is ordered - oxyCODONE-acetaminophen (PERCOCET) 10-325 MG tablet; Take 1 tablet by mouth every 6 (six) hours as needed for pain.  Dispense: 120 tablet; Refill: 0 - ToxASSURE Select 13 (MW), Urine  6. Chronic right shoulder pain Mobile chair is ordered   Continue all other maintenance medications as listed above.  Follow up plan: Return in about 4 weeks (around 11/15/2018).  Educational handout given for survey  Remus Loffler PA-C [facility] [address]    [place], [place] [address] [phone]   10/19/2018, 9:23 PM

## 2018-10-19 NOTE — Telephone Encounter (Signed)
Patient aware.

## 2018-10-21 LAB — TOXASSURE SELECT 13 (MW), URINE

## 2018-10-28 ENCOUNTER — Other Ambulatory Visit: Payer: Self-pay | Admitting: *Deleted

## 2018-10-28 MED ORDER — BUMETANIDE 1 MG PO TABS: 1.0000 mg | ORAL_TABLET | Freq: Two times a day (BID) | ORAL | 0 refills | Status: DC

## 2018-11-01 ENCOUNTER — Ambulatory Visit: Payer: Medicaid Other | Admitting: Physician Assistant

## 2018-11-17 ENCOUNTER — Encounter: Payer: Self-pay | Admitting: Physician Assistant

## 2018-11-17 ENCOUNTER — Other Ambulatory Visit: Payer: Self-pay

## 2018-11-17 ENCOUNTER — Ambulatory Visit (INDEPENDENT_AMBULATORY_CARE_PROVIDER_SITE_OTHER): Payer: Medicaid Other | Admitting: Physician Assistant

## 2018-11-17 ENCOUNTER — Telehealth (INDEPENDENT_AMBULATORY_CARE_PROVIDER_SITE_OTHER): Payer: Medicaid Other | Admitting: Physician Assistant

## 2018-11-17 VITALS — BP 137/87 | HR 96 | Temp 97.7°F | Ht 65.0 in | Wt 389.4 lb

## 2018-11-17 DIAGNOSIS — M5136 Other intervertebral disc degeneration, lumbar region: Secondary | ICD-10-CM

## 2018-11-17 DIAGNOSIS — M5416 Radiculopathy, lumbar region: Secondary | ICD-10-CM | POA: Insufficient documentation

## 2018-11-17 DIAGNOSIS — M2578 Osteophyte, vertebrae: Secondary | ICD-10-CM

## 2018-11-17 MED ORDER — OXYCODONE-ACETAMINOPHEN 10-325 MG PO TABS: 1.0000 | ORAL_TABLET | Freq: Four times a day (QID) | ORAL | 0 refills | Status: DC | PRN

## 2018-11-17 MED ORDER — NALOXONE HCL 4 MG/0.1ML NA LIQD: 1.0000 | Freq: Once | NASAL | 1 refills | Status: AC

## 2018-11-17 MED ORDER — AZITHROMYCIN 250 MG PO TABS: ORAL_TABLET | ORAL | 0 refills | Status: DC

## 2018-11-17 MED ORDER — CETIRIZINE HCL 10 MG PO TABS: ORAL_TABLET | ORAL | 11 refills | Status: AC

## 2018-11-17 NOTE — Progress Notes (Signed)
BP 137/87    Pulse 96    Temp 97.7 F (36.5 C) (Oral)    Ht  (1.651 m)    Wt (!) 389 lb 6.4 oz (176.6 kg)    BMI 64.80 kg/m    Subjective:    Patient ID: Marissa [name], female    DOB: 1966/08/01, 53 y.o.   MRN: [id number]  HPI: Marissa [name] is a 53 y.o. female presenting on 11/17/2018 for No chief complaint on file.   PAIN ASSESSMENT: Cause of pain- DDD Nerve root impingement vertebral osteophyte  2019 MRI 1. L5-S1 chronic inferiorly migrating disc extrusion with right S1 impingement. The herniation has mildly contracted since 2015. 2. L4-5 chronic inferiorly migrating disc extrusion which contacts but does not definitely compress the L5 nerve roots.  Lumbar :left lateral osteophytes at L4-5 mild narrowing at 4551 with spurring and facet  degenerative changes without anterolisthesis.   This patient returns for a 3 month recheck on narcotic use for the above named conditions  Current medications- percocet 10/325 1 QID for severe pain Ibuprofen Currently under psychiatry treatment and will not change her to cymbalta at this time Medication side effects- none Any concerns- no  Pain on scale of 1-10- 8 Frequency- daily What increases pain- standing What makes pain Better- rest Effects on ADL - moderate to severe some days Any change in general medical condition- no  Effectiveness of current meds- good Adverse reactions form pain meds-no PMP AWARE website reviewed: Yes Any suspicious activity on PMP Aware: No MME daily dose: 60 MME  Consider gabapentin at next visit, then lower percocet to 7.5 mg dose  Contract on file Last UDS  10/18/18  [facility] script sent or current  History of overdose or risk of abuse no   Past Medical History:  Diagnosis Date   Arthritis    Asthma    Atelectasis pulmonary    Back pain    CHF (congestive heart failure) (HCC)    11/2016   Chronic back pain    COPD (chronic obstructive pulmonary disease) (HCC)    Depression      Depression with anxiety    Dyspnea    Emphysema lung (HCC)    Emphysema lung (HCC)    Fibromyalgia    GERD (gastroesophageal reflux disease)    Headache(784.0)    HX MIGRAINES   Hypercholesterolemia    Hypertension    Insomnia    Morbid obesity (HCC)    Pneumonia    MARCH   2018   Requires supplemental oxygen    3 LITERS AT HS   Sleep apnea    BIPAP +  O2   Relevant past medical, surgical, family and social history reviewed and updated as indicated. Interim medical history since our last visit reviewed. Allergies and medications reviewed and updated. DATA REVIEWED: CHART IN EPIC  Family History reviewed for pertinent findings.  Review of Systems  Constitutional: Negative.   HENT: Negative.   Eyes: Negative.   Respiratory: Negative.   Gastrointestinal: Negative.   Genitourinary: Negative.   Musculoskeletal: Positive for arthralgias, back pain, gait problem and myalgias. Negative for joint swelling.  Neurological: Positive for weakness.    Allergies as of 11/17/2018      Reactions   Advera [compleat] Itching   Throat Itching   Relpax [eletriptan] Other (See Comments)   Weakness, couldn't move   Advair Diskus [fluticasone-salmeterol]    Made throat feel itchy.   Ativan [lorazepam]    Citalopram    Paroxetine  Hcl    Sertraline    Morphine And Related Itching      Medication List       Accurate as of November 17, 2018  9:32 PM. Always use your most recent med list.        albuterol 108 (90 Base) MCG/ACT inhaler Commonly known as:  PROVENTIL HFA;VENTOLIN HFA Inhale 2 puffs into the lungs every 4 (four) hours as needed for wheezing or shortness of breath.   albuterol (2.5 MG/3ML) 0.083% nebulizer solution Commonly known as:  PROVENTIL Take 3 mLs (2.5 mg total) by nebulization every 6 (six) hours as needed for wheezing or shortness of breath.   ALPRAZolam 1 MG tablet Commonly known as:  XANAX Take 1 mg by mouth 3 (three) times daily.    azithromycin 250 MG tablet Commonly known as:  Zithromax Z-Pak Take as directed   benzonatate 100 MG capsule Commonly known as:  TESSALON Take 1 capsule (100 mg total) by mouth 3 (three) times daily.   bumetanide 1 MG tablet Commonly known as:  Bumex Take 1 tablet (1 mg total) by mouth 2 (two) times daily.   ibuprofen 200 MG tablet Commonly known as:  ADVIL,MOTRIN Take 800 mg by mouth every 8 (eight) hours as needed for mild pain or moderate pain.   mometasone-formoterol 200-5 MCG/ACT Aero Commonly known as:  Dulera Inhale 2 puffs into the lungs daily.   multivitamin with minerals tablet Take 1 tablet by mouth daily.   NON FORMULARY Pt uses CPAP machine at bedtime   ondansetron 8 MG tablet Commonly known as:  ZOFRAN TAKE 1 TABLET EVERY 8 HOURS AS NEEDED FOR NAUSEA AND VOMITING   oxyCODONE-acetaminophen 10-325 MG tablet Commonly known as:  PERCOCET Take 1 tablet by mouth every 6 (six) hours as needed for pain.   oxyCODONE-acetaminophen 10-325 MG tablet Commonly known as:  PERCOCET Take 1 tablet by mouth every 6 (six) hours as needed for pain.   oxyCODONE-acetaminophen 10-325 MG tablet Commonly known as:  PERCOCET Take 1 tablet by mouth every 6 (six) hours as needed for pain.   OXYGEN Inhale 3 L into the lungs at bedtime.   Potassium 99 MG Tabs Take 1 tablet by mouth as needed.   predniSONE 10 MG tablet Commonly known as:  DELTASONE Take 4 tabs for 2 days, then 3 tabs for 2 days, then 2 tabs for 2 days, then 1 tab for 2 days, then stop   rOPINIRole 2 MG tablet Commonly known as:  REQUIP Take 1 tablet (2 mg total) by mouth 2 (two) times daily.   tiotropium 18 MCG inhalation capsule Commonly known as:  Spiriva HandiHaler Place 1 capsule (18 mcg total) into inhaler and inhale daily.   Tiotropium Bromide-Olodaterol 2.5-2.5 MCG/ACT Aers Commonly known as:  Stiolto Respimat Inhale 2 puffs into the lungs daily.   Vitamin D (Ergocalciferol) 1.25 MG (50000 UT)  Caps capsule Commonly known as:  DRISDOL Take 1 capsule (50,000 Units total) by mouth every 7 (seven) days.   ZyrTEC Allergy 10 MG Caps Generic drug:  Cetirizine HCl Take 10 mg by mouth daily.   cetirizine 10 MG tablet Commonly known as:  ZYRTEC TAKE 1 TABLET DAILY AS NEEDED FOR ALLERGIES          Objective:    BP 137/87    Pulse 96    Temp 97.7 F (36.5 C) (Oral)    Ht  (1.651 m)    Wt (!) 389 lb 6.4 oz (176.6 kg)  BMI 64.80 kg/m   Allergies  Allergen Reactions   Advera [Compleat] Itching    Throat Itching   Relpax [Eletriptan] Other (See Comments)    Weakness, couldn't move   Advair Diskus [Fluticasone-Salmeterol]     Made throat feel itchy.   Ativan [Lorazepam]    Citalopram    Paroxetine Hcl    Sertraline    Morphine And Related Itching    Wt Readings from Last 3 Encounters:  11/17/18 (!) 389 lb 6.4 oz (176.6 kg)  10/18/18 (!) 390 lb 12.8 oz (177.3 kg)  08/27/18 (!) 383 lb (173.7 kg)    Physical Exam Constitutional:      Appearance: She is well-developed.  HENT:     Head: Normocephalic and atraumatic.  Eyes:     Conjunctiva/sclera: Conjunctivae normal.     Pupils: Pupils are equal, round, and reactive to light.  Cardiovascular:     Rate and Rhythm: Normal rate and regular rhythm.     Heart sounds: Normal heart sounds.  Pulmonary:     Effort: Pulmonary effort is normal.     Breath sounds: Normal breath sounds.  Abdominal:     General: Bowel sounds are normal.     Palpations: Abdomen is soft.  Musculoskeletal:     Lumbar back: She exhibits decreased range of motion, tenderness, pain and spasm.       Back:  Skin:    General: Skin is warm and dry.     Findings: No rash.  Neurological:     Mental Status: She is alert and oriented to person, place, and time.     Deep Tendon Reflexes: Reflexes are normal and symmetric.  Psychiatric:        Behavior: Behavior normal.        Thought Content: Thought content normal.        Judgment:  Judgment normal.     Results for orders placed or performed in visit on 10/18/18  ToxASSURE Select 13 (MW), Urine  Result Value Ref Range   Summary FINAL       Assessment & Plan:   1. Lumbar degenerative disc disease - oxyCODONE-acetaminophen (PERCOCET) 10-325 MG tablet; Take 1 tablet by mouth every 6 (six) hours as needed for pain.  Dispense: 120 tablet; Refill: 0 - oxyCODONE-acetaminophen (PERCOCET) 10-325 MG tablet; Take 1 tablet by mouth every 6 (six) hours as needed for pain.  Dispense: 120 tablet; Refill: 0 - oxyCODONE-acetaminophen (PERCOCET) 10-325 MG tablet; Take 1 tablet by mouth every 6 (six) hours as needed for pain.  Dispense: 120 tablet; Refill: 0  2. Lumbar nerve root impingement - oxyCODONE-acetaminophen (PERCOCET) 10-325 MG tablet; Take 1 tablet by mouth every 6 (six) hours as needed for pain.  Dispense: 120 tablet; Refill: 0 - oxyCODONE-acetaminophen (PERCOCET) 10-325 MG tablet; Take 1 tablet by mouth every 6 (six) hours as needed for pain.  Dispense: 120 tablet; Refill: 0 - oxyCODONE-acetaminophen (PERCOCET) 10-325 MG tablet; Take 1 tablet by mouth every 6 (six) hours as needed for pain.  Dispense: 120 tablet; Refill: 0  3. Osteophyte of vertebrae - oxyCODONE-acetaminophen (PERCOCET) 10-325 MG tablet; Take 1 tablet by mouth every 6 (six) hours as needed for pain.  Dispense: 120 tablet; Refill: 0 - oxyCODONE-acetaminophen (PERCOCET) 10-325 MG tablet; Take 1 tablet by mouth every 6 (six) hours as needed for pain.  Dispense: 120 tablet; Refill: 0 - oxyCODONE-acetaminophen (PERCOCET) 10-325 MG tablet; Take 1 tablet by mouth every 6 (six) hours as needed for pain.  Dispense: 120 tablet; Refill:  0    Continue all other maintenance medications as listed above.  Follow up plan: Recheck 3 months  Educational handout given for survey  Remus Loffler PA-C [facility] [address]  [place], [place] [address] [phone]   11/17/2018, 9:33 PM

## 2018-11-17 NOTE — Progress Notes (Deleted)
Telephone visit  Subjective: CC:*** PCP: Remus Loffler, PA-C PYP:[id number] [name] is a 53 y.o. female calls for telephone consult today. Patient provides verbal consent for consult held via phone.  Location of patient: *** Location of provider: [facility] Others present for call: ***  1. ***   ROS: Per HPI  Allergies  Allergen Reactions  . Advera [Compleat] Itching    Throat Itching  . Relpax [Eletriptan] Other (See Comments)    Weakness, couldn't move  . Advair Diskus [Fluticasone-Salmeterol]     Made throat feel itchy.  . Ativan [Lorazepam]   . Citalopram   . Paroxetine Hcl   . Sertraline   . Morphine And Related Itching   Past Medical History:  Diagnosis Date  . Arthritis   . Asthma   . Atelectasis pulmonary   . Back pain   . CHF (congestive heart failure) (HCC)    11/2016  . Chronic back pain   . COPD (chronic obstructive pulmonary disease) (HCC)   . Depression   . Depression with anxiety   . Dyspnea   . Emphysema lung (HCC)   . Emphysema lung (HCC)   . Fibromyalgia   . GERD (gastroesophageal reflux disease)   . Headache(784.0)    HX MIGRAINES  . Hypercholesterolemia   . Hypertension   . Insomnia   . Morbid obesity (HCC)   . Pneumonia    MARCH   2018  . Requires supplemental oxygen    3 LITERS AT HS  . Sleep apnea    BIPAP +  O2    Current Outpatient Medications:  .  albuterol (PROVENTIL HFA;VENTOLIN HFA) 108 (90 BASE) MCG/ACT inhaler, Inhale 2 puffs into the lungs every 4 (four) hours as needed for wheezing or shortness of breath., Disp: 1 Inhaler, Rfl: 2 .  albuterol (PROVENTIL) (2.5 MG/3ML) 0.083% nebulizer solution, Take 3 mLs (2.5 mg total) by nebulization every 6 (six) hours as needed for wheezing or shortness of breath., Disp: 240 mL, Rfl: 11 .  ALPRAZolam (XANAX) 1 MG tablet, Take 1 mg by mouth 3 (three) times daily. , Disp: , Rfl:  .  benzonatate (TESSALON) 100 MG capsule, Take 1 capsule (100 mg total) by mouth 3 (three) times daily., Disp: 30  capsule, Rfl: 1 .  bumetanide (BUMEX) 1 MG tablet, Take 1 tablet (1 mg total) by mouth 2 (two) times daily., Disp: 60 tablet, Rfl: 0 .  cefdinir (OMNICEF) 300 MG capsule, Take 1 capsule (300 mg total) by mouth 2 (two) times daily. 1 po BID, Disp: 20 capsule, Rfl: 0 .  cetirizine (ZYRTEC) 10 MG tablet, TAKE 1 TABLET DAILY AS NEEDED FOR ALLERGIES, Disp: 30 tablet, Rfl: 11 .  Cetirizine HCl (ZYRTEC ALLERGY) 10 MG CAPS, Take 10 mg by mouth daily. , Disp: , Rfl:  .  ibuprofen (ADVIL,MOTRIN) 200 MG tablet, Take 800 mg by mouth every 8 (eight) hours as needed for mild pain or moderate pain., Disp: , Rfl:  .  mometasone-formoterol (DULERA) 200-5 MCG/ACT AERO, Inhale 2 puffs into the lungs daily., Disp: 13 g, Rfl: 11 .  Multiple Vitamins-Minerals (MULTIVITAMIN WITH MINERALS) tablet, Take 1 tablet by mouth daily., Disp: , Rfl:  .  NON FORMULARY, Pt uses CPAP machine at bedtime, Disp: , Rfl:  .  ondansetron (ZOFRAN) 8 MG tablet, TAKE 1 TABLET EVERY 8 HOURS AS NEEDED FOR NAUSEA AND VOMITING, Disp: 20 tablet, Rfl: 2 .  oxyCODONE-acetaminophen (PERCOCET) 10-325 MG tablet, Take 1 tablet by mouth every 6 (six) hours as needed for  pain., Disp: 120 tablet, Rfl: 0 .  oxyCODONE-acetaminophen (PERCOCET) 10-325 MG tablet, Take 1 tablet by mouth every 8 (eight) hours as needed for pain., Disp: 30 tablet, Rfl: 0 .  OXYGEN, Inhale 3 L into the lungs at bedtime., Disp: , Rfl:  .  Potassium 99 MG TABS, Take 1 tablet by mouth as needed. , Disp: , Rfl:  .  predniSONE (DELTASONE) 10 MG tablet, Take 4 tabs for 2 days, then 3 tabs for 2 days, then 2 tabs for 2 days, then 1 tab for 2 days, then stop, Disp: 20 tablet, Rfl: 0 .  rOPINIRole (REQUIP) 2 MG tablet, Take 1 tablet (2 mg total) by mouth 2 (two) times daily., Disp: 120 tablet, Rfl: 5 .  tiotropium (SPIRIVA HANDIHALER) 18 MCG inhalation capsule, Place 1 capsule (18 mcg total) into inhaler and inhale daily., Disp: 30 capsule, Rfl: 12 .  Tiotropium Bromide-Olodaterol (STIOLTO  RESPIMAT) 2.5-2.5 MCG/ACT AERS, Inhale 2 puffs into the lungs daily., Disp: 2 Inhaler, Rfl: 0 .  Vitamin D, Ergocalciferol, (DRISDOL) 50000 units CAPS capsule, Take 1 capsule (50,000 Units total) by mouth every 7 (seven) days., Disp: 4 capsule, Rfl: 11  Assessment/ Plan: 53 y.o. female   ***  Start time: *** End time: ***  No orders of the defined types were placed in this encounter.   Marissa Feeler PA-C [facility] [phone]

## 2019-02-17 ENCOUNTER — Other Ambulatory Visit: Payer: Self-pay

## 2019-02-18 ENCOUNTER — Encounter: Payer: Self-pay | Admitting: Physician Assistant

## 2019-02-18 ENCOUNTER — Ambulatory Visit: Payer: Medicaid Other | Admitting: Physician Assistant

## 2019-02-18 ENCOUNTER — Other Ambulatory Visit: Payer: Self-pay

## 2019-02-18 VITALS — BP 139/87 | HR 94 | Temp 98.6°F | Ht 65.0 in | Wt >= 6400 oz

## 2019-02-18 DIAGNOSIS — Z0000 Encounter for general adult medical examination without abnormal findings: Secondary | ICD-10-CM

## 2019-02-18 DIAGNOSIS — F319 Bipolar disorder, unspecified: Secondary | ICD-10-CM

## 2019-02-18 DIAGNOSIS — M5136 Other intervertebral disc degeneration, lumbar region: Secondary | ICD-10-CM | POA: Diagnosis not present

## 2019-02-18 DIAGNOSIS — M2578 Osteophyte, vertebrae: Secondary | ICD-10-CM

## 2019-02-18 DIAGNOSIS — M5416 Radiculopathy, lumbar region: Secondary | ICD-10-CM | POA: Diagnosis not present

## 2019-02-18 DIAGNOSIS — L539 Erythematous condition, unspecified: Secondary | ICD-10-CM

## 2019-02-18 LAB — BAYER DCA HB A1C WAIVED: HB A1C (BAYER DCA - WAIVED): 6.8 % (ref <7.0)

## 2019-02-18 MED ORDER — OXYCODONE-ACETAMINOPHEN 7.5-325 MG PO TABS: 1.0000 | ORAL_TABLET | Freq: Four times a day (QID) | ORAL | 0 refills | Status: DC | PRN

## 2019-02-18 MED ORDER — CEPHALEXIN 500 MG PO CAPS: 500.0000 mg | ORAL_CAPSULE | Freq: Four times a day (QID) | ORAL | 0 refills | Status: DC

## 2019-02-18 NOTE — Patient Instructions (Addendum)
END IT   Pressure Injury  A pressure injury is damage to the skin and underlying tissue that results from pressure being applied to an area of the body. It often affects people who must spend a long time in a bed or chair because of a medical condition. Pressure injuries usually occur:  Over bony parts of the body, such as the tailbone, shoulders, elbows, hips, heels, spine, ankles, and back of the head.  Under medical devices that make contact with the body, such as respiratory equipment, stockings, tubes, and splints. Pressure injuries start as reddened areas on the skin and can lead to pain and an open wound. What are the causes? This condition is caused by frequent or constant pressure to an area of the body. Decreased blood flow to the skin can eventually cause the skin tissue to die and break down, causing a wound. What increases the risk? You are more likely to develop this condition if you:  Are in the hospital or an extended care facility.  Are bedridden or in a wheelchair.  Have an injury or disease that keeps you from: ? Moving normally. ? Feeling pain or pressure.  Have a condition that: ? Makes you sleepy or less alert. ? Causes poor blood flow.  Need to wear a medical device.  Have poor control of your bladder or bowel functions (incontinence).  Have poor nutrition (malnutrition). If you are at risk for pressure injuries, your health care provider may recommend certain types of mattresses, mattress covers, pillows, cushions, or boots to help prevent them. These may include products filled with air, foam, gel, or sand. What are the signs or symptoms? Symptoms of this condition depend on the severity of the injury. Symptoms may include:  Red or dark areas of the skin.  Pain, warmth, or a change of skin texture.  Blisters.  An open wound. How is this diagnosed? This condition is diagnosed with a medical history and physical exam. You may also have tests, such  as:  Blood tests.  Imaging tests.  Blood flow tests. Your pressure injury will be staged based on its severity. Staging is based on:  The depth of the tissue injury, including whether there is exposure of muscle, bone, or tendon.  The cause of the pressure injury. How is this treated? This condition may be treated by:  Relieving or redistributing pressure on your skin. This includes: ? Frequently changing your position. ? Avoiding positions that caused the wound or that can make the wound worse. ? Using specific bed mattresses, chair cushions, or protective boots. ? Moving medical devices from an area of pressure, or placing padding between the skin and the device. ? Using foams, creams, or powders to prevent rubbing (friction) on the skin.  Keeping your skin clean and dry. This may include using a skin cleanser or skin barrier as told by your health care provider.  Cleaning your injury and removing any dead tissue from the wound (debridement).  Placing a bandage (dressing) over your injury.  Using medicines for pain or to prevent or treat infection. Surgery may be needed if other treatments are not working or if your injury is very deep. Follow these instructions at home: Wound care  Follow instructions from your health care provider about how to take care of your wound. Make sure you: ? Wash your hands with soap and water before and after you change your bandage (dressing). If soap and water are not available, use hand sanitizer. ? Change  your dressing as told by your health care provider.  Check your wound every day for signs of infection. Have a caregiver do this for you if you are not able. Check for: ? Redness, swelling, or increased pain. ? More fluid or blood. ? Warmth. ? Pus or a bad smell. Skin care  Keep your skin clean and dry. Gently pat your skin dry.  Do not rub or massage your skin.  You or a caregiver should check your skin every day for any changes  in color or any new blisters or sores (ulcers). Medicines  Take over-the-counter and prescription medicines only as told by your health care provider.  If you were prescribed an antibiotic medicine, take or apply it as told by your health care provider. Do not stop using the antibiotic even if your condition improves. Reducing and redistributing pressure  Do not lie or sit in one position for a long time. Move or change position every 1-2 hours, or as told by your health care provider.  Use pillows or cushions to reduce pressure. Ask your health care provider to recommend cushions or pads for you. General instructions   Eat a healthy diet that includes lots of protein.  Drink enough fluid to keep your urine pale yellow.  Be as active as you can every day. Ask your health care provider to suggest safe exercises or activities.  Do not abuse drugs or alcohol.  Do not use any products that contain nicotine or tobacco, such as cigarettes, e-cigarettes, and chewing tobacco. If you need help quitting, ask your health care provider.  Keep all follow-up visits as told by your health care provider. This is important. Contact a health care provider if:  You have: ? A fever or chills. ? Pain that is not helped by medicine. ? Any changes in skin color. ? New blisters or sores. ? Pus or a bad smell coming from your wound. ? Redness, swelling, or pain around your wound. ? More fluid or blood coming from your wound.  Your wound does not improve after 1-2 weeks of treatment. Summary  A pressure injury is damage to the skin and underlying tissue that results from pressure being applied to an area of the body.  Do not lie or sit in one position for a long time. Your health care provider may advise you to move or change position every 1-2 hours.  Follow instructions from your health care provider about how to take care of your wound.  Keep all follow-up visits as told by your health care  provider. This is important. This information is not intended to replace advice given to you by your health care provider. Make sure you discuss any questions you have with your health care provider. Document Released: 08/11/2005 Document Revised: 03/10/2018 Document Reviewed: 03/10/2018 [employer] Patient Education  [place].

## 2019-02-18 NOTE — Progress Notes (Signed)
PAIN ASSESSMENT: Cause of pain- DDD Nerve root impingement vertebral osteophyte  2019 MRI 1. L5-S1 chronic inferiorly migrating disc extrusion with right S1 impingement. The herniation has mildly contracted since 2015. 2. L4-5 chronic inferiorly migrating disc extrusion which contacts but does not definitely compress the L5 nerve roots.  Lumbar :left lateral osteophytes at L4-5 mild narrowing at 4551 with spurring and facet  degenerative changes without anterolisthesis.   This patient returns for a 3 month recheck on narcotic use for the above named conditions  Current medications- percocet 10/325 1 QID for severe pain Ibuprofen Currently under psychiatry treatment and will not change her to cymbalta at this time Medication side effects- none Any concerns- no  Pain on scale of 1-10- 8 Frequency- daily What increases pain- standing What makes pain Better- rest Effects on ADL - moderate to severe some days Any change in general medical condition- no  Effectiveness of current meds- good Adverse reactions form pain meds-no PMP AWARE website reviewed: Yes Any suspicious activity on PMP Aware: No MME daily dose: 60 MME    BP 139/87   Pulse 94   Temp 98.6 F (37 C) (Oral)   Ht _0  (1.651 m)   Wt (!) 412 lb 6.4 oz (187.1 kg)   SpO2 (!) 86%   BMI 68.63 kg/m    Subjective:    Patient ID: Marissa [name], female    DOB: May 02, 1966, 53 y.o.   MRN: [id number]  HPI: Meghana Tullo is a 53 y.o. female presenting on 02/18/2019 for Hypertension (3 mth ) and Pain  Comes in for 56-monthperiodic recheck on her chronic medical conditions.  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  She does need some refills on her chronic medications.  She does see Dr. RReece Levyand get her Vraylar, Lamictal and Xanax.  He had performed a urine drug screen on 10/18/2018.  We are again asked for copy of this  from his office.  This time she is been having this red rash of the skin but is scaling.  She has had no hives or blisters.  There are no pustules.  She has tried every lotion over-the-counter, even coconut oil, she is changed all of her cleaning products.  There is been no improvement in her rash.  She also has a small pressure sore on the top of her buttock at the fold  PAIN ASSESSMENT: Cause of pain- DDD Nerve root impingement vertebral osteophyte  2019 MRI 1. L5-S1 chronic inferiorly migrating disc extrusion with right S1 impingement. The herniation has mildly contracted since 2015. 2. L4-5 chronic inferiorly migrating disc extrusion which contacts but does not definitely compress the L5 nerve roots.  Lumbar :left lateral osteophytes at L4-5 mild narrowing at 4551 with spurring and facet  degenerative changes without anterolisthesis.   This patient returns for a 3 month recheck on narcotic use for the above named conditions  Current medications- percocet 10/325 1 QID for severe pain  We are planning to lower her Percocet to 7.5/325 1 tablet 4 times a day we will do this over the next 3 months.  This should get [place] get this at our goal of 50 MME or under.  Ibuprofen Currently under psychiatry treatment and will not change her to cymbalta at this time Medication side effects- none Any concerns- no  Pain on scale of 1-10- 8 Frequency- daily What increases pain- standing What makes pain Better- rest Effects on  ADL - moderate to severe some days Any change in general medical condition- no  Effectiveness of current meds- good Adverse reactions form pain meds-no PMP AWARE website reviewed: Yes Any suspicious activity on PMP Aware: No MME daily dose: 60 MME.  Past Medical History:  Diagnosis Date  . Arthritis   . Asthma   . Atelectasis pulmonary   . Back pain   . CHF (congestive heart failure) ([place])    11/2016  . Chronic back pain   . COPD (chronic obstructive  pulmonary disease) ([place])   . Depression   . Depression with anxiety   . Dyspnea   . Emphysema lung ([place])   . Emphysema lung ([place])   . Fibromyalgia   . GERD (gastroesophageal reflux disease)   . Headache(784.0)    HX MIGRAINES  . Hypercholesterolemia   . Hypertension   . Insomnia   . Morbid obesity ([place])   . Pneumonia    MARCH   2018  . Requires supplemental oxygen    3 LITERS AT HS  . Sleep apnea    BIPAP +  O2   Relevant past medical, surgical, family and social history reviewed and updated as indicated. Interim medical history since our last visit reviewed. Allergies and medications reviewed and updated. DATA REVIEWED: CHART IN EPIC  Family History reviewed for pertinent findings.  Review of Systems  Constitutional: Negative.  Negative for activity change, fatigue and fever.  HENT: Negative.   Eyes: Negative.   Respiratory: Negative.  Negative for cough.   Cardiovascular: Negative.  Negative for chest pain.  Gastrointestinal: Negative.  Negative for abdominal pain.  Endocrine: Negative.   Genitourinary: Negative.  Negative for dysuria.  Musculoskeletal: Positive for arthralgias, back pain, joint swelling and myalgias.  Skin: Positive for color change and wound.  Neurological: Negative.     Allergies as of 02/18/2019      Reactions   Advera [compleat] Itching   Throat Itching   Relpax [eletriptan] Other (See Comments)   Weakness, couldn't move   Advair Diskus [fluticasone-salmeterol]    Made throat feel itchy.   Ativan [lorazepam]    Citalopram    Paroxetine Hcl    Sertraline    Morphine And Related Itching      Medication List       Accurate as of February 18, 2019 11:59 PM. If you have any questions, ask your nurse or doctor.        STOP taking these medications   azithromycin 250 MG tablet Commonly known as: Zithromax Z-Pak Stopped by: Terald Sleeper, PA-C   benzonatate 100 MG capsule Commonly known as: TESSALON Stopped by: Terald Sleeper, PA-C    bumetanide 1 MG tablet Commonly known as: Bumex Stopped by: Terald Sleeper, PA-C   oxyCODONE-acetaminophen 10-325 MG tablet Commonly known as: PERCOCET Replaced by: oxyCODONE-acetaminophen 7.5-325 MG tablet Stopped by: Terald Sleeper, PA-C   oxyCODONE-acetaminophen 10-325 MG tablet Commonly known as: PERCOCET Replaced by: oxyCODONE-acetaminophen 7.5-325 MG tablet Stopped by: Terald Sleeper, PA-C   oxyCODONE-acetaminophen 10-325 MG tablet Commonly known as: PERCOCET Replaced by: oxyCODONE-acetaminophen 7.5-325 MG tablet Stopped by: Terald Sleeper, PA-C   predniSONE 10 MG tablet Commonly known as: DELTASONE Stopped by: Terald Sleeper, PA-C   tiotropium 18 MCG inhalation capsule Commonly known as: Spiriva HandiHaler Stopped by: Terald Sleeper, PA-C     TAKE these medications   albuterol 108 (90 Base) MCG/ACT inhaler Commonly known as: VENTOLIN HFA Inhale 2 puffs into the lungs  every 4 (four) hours as needed for wheezing or shortness of breath.   albuterol (2.5 MG/3ML) 0.083% nebulizer solution Commonly known as: PROVENTIL Take 3 mLs (2.5 mg total) by nebulization every 6 (six) hours as needed for wheezing or shortness of breath.   ALPRAZolam 1 MG tablet Commonly known as: XANAX Take 1 mg by mouth 3 (three) times daily.   cephALEXin 500 MG capsule Commonly known as: KEFLEX Take 1 capsule (500 mg total) by mouth 4 (four) times daily. Started by: Terald Sleeper, PA-C   cetirizine 10 MG tablet Commonly known as: ZYRTEC TAKE 1 TABLET DAILY AS NEEDED FOR ALLERGIES What changed: Another medication with the same name was removed. Continue taking this medication, and follow the directions you see here. Changed by: Terald Sleeper, PA-C   furosemide 20 MG tablet Commonly known as: LASIX Take 20 mg by mouth 2 (two) times daily.   ibuprofen 200 MG tablet Commonly known as: ADVIL Take 800 mg by mouth every 8 (eight) hours as needed for mild pain or moderate pain.   lamoTRIgine 25  MG tablet Commonly known as: LAMICTAL Take 25 mg by mouth 2 (two) times daily.   mometasone-formoterol 200-5 MCG/ACT Aero Commonly known as: Dulera Inhale 2 puffs into the lungs daily.   multivitamin with minerals tablet Take 1 tablet by mouth daily.   Narcan 4 MG/0.1ML Liqd nasal spray kit Generic drug: naloxone CALL 911. ADMINISTER A SINGLE SPRAY OF NARCAN IN ONE NOSTRIL, REPEAT EVERY 3 MINUTES AS NEEDED IF NO OR MINIMAL RESPONSE   NON FORMULARY Pt uses CPAP machine at bedtime   ondansetron 8 MG tablet Commonly known as: ZOFRAN TAKE 1 TABLET EVERY 8 HOURS AS NEEDED FOR NAUSEA AND VOMITING   oxyCODONE-acetaminophen 7.5-325 MG tablet Commonly known as: Percocet Take 1 tablet by mouth every 6 (six) hours as needed for severe pain. Replaces: oxyCODONE-acetaminophen 10-325 MG tablet Started by: Terald Sleeper, PA-C   oxyCODONE-acetaminophen 7.5-325 MG tablet Commonly known as: Percocet Take 1 tablet by mouth every 6 (six) hours as needed for severe pain. Replaces: oxyCODONE-acetaminophen 10-325 MG tablet Started by: Terald Sleeper, PA-C   oxyCODONE-acetaminophen 7.5-325 MG tablet Commonly known as: Percocet Take 1 tablet by mouth every 6 (six) hours as needed for severe pain. Replaces: oxyCODONE-acetaminophen 10-325 MG tablet Started by: Terald Sleeper, PA-C   OXYGEN Inhale 3 L into the lungs at bedtime.   Potassium 99 MG Tabs Take 1 tablet by mouth as needed.   rOPINIRole 2 MG tablet Commonly known as: REQUIP Take 1 tablet (2 mg total) by mouth 2 (two) times daily.   Tiotropium Bromide-Olodaterol 2.5-2.5 MCG/ACT Aers Commonly known as: Stiolto Respimat Inhale 2 puffs into the lungs daily.   Vitamin D (Ergocalciferol) 1.25 MG (50000 UT) Caps capsule Commonly known as: DRISDOL Take 1 capsule (50,000 Units total) by mouth every 7 (seven) days.   Vraylar 4.5 MG Caps Generic drug: Cariprazine HCl Take 1 capsule by mouth at bedtime.          Objective:    BP  139/87   Pulse 94   Temp 98.6 F (37 C) (Oral)   Ht _0  (1.651 m)   Wt (!) 412 lb 6.4 oz (187.1 kg)   SpO2 (!) 86%   BMI 68.63 kg/m   Allergies  Allergen Reactions  . Advera [Compleat] Itching    Throat Itching  . Relpax [Eletriptan] Other (See Comments)    Weakness, couldn't move  . Advair Diskus [Fluticasone-Salmeterol]  Made throat feel itchy.  . Ativan [Lorazepam]   . Citalopram   . Paroxetine Hcl   . Sertraline   . Morphine And Related Itching    Wt Readings from Last 3 Encounters:  02/18/19 (!) 412 lb 6.4 oz (187.1 kg)  11/17/18 (!) 389 lb 6.4 oz (176.6 kg)  10/18/18 (!) 390 lb 12.8 oz (177.3 kg)    Physical Exam Constitutional:      Appearance: She is well-developed.  HENT:     Head: Normocephalic and atraumatic.  Eyes:     Conjunctiva/sclera: Conjunctivae normal.     Pupils: Pupils are equal, round, and reactive to light.  Cardiovascular:     Rate and Rhythm: Normal rate and regular rhythm.     Heart sounds: Normal heart sounds.  Pulmonary:     Effort: Pulmonary effort is normal.     Breath sounds: Normal breath sounds.  Abdominal:     General: Bowel sounds are normal.     Palpations: Abdomen is soft.  Skin:    General: Skin is warm and dry.     Findings: No rash.          Comments: Flat red rash over entire body with lots of scaling the skin Small healing pressure sore at the buttock  Neurological:     Mental Status: She is alert and oriented to person, place, and time.     Deep Tendon Reflexes: Reflexes are normal and symmetric.  Psychiatric:        Behavior: Behavior normal.        Thought Content: Thought content normal.        Judgment: Judgment normal.     Results for orders placed or performed in visit on 02/18/19  CMP14+EGFR  Result Value Ref Range   Glucose 119 (H) 65 - 99 mg/dL   BUN 8 6 - 24 mg/dL   Creatinine, Ser 0.65 0.57 - 1.00 mg/dL   GFR calc non Af Amer 103 >59 mL/min/1.73   GFR calc Af Amer 119 >59 mL/min/1.73    BUN/Creatinine Ratio 12 9 - 23   Sodium 134 134 - 144 mmol/L   Potassium 4.1 3.5 - 5.2 mmol/L   Chloride 91 (L) 96 - 106 mmol/L   CO2 29 20 - 29 mmol/L   Calcium 9.0 8.7 - 10.2 mg/dL   Total Protein 7.1 6.0 - 8.5 g/dL   Albumin 3.6 (L) 3.8 - 4.9 g/dL   Globulin, Total 3.5 1.5 - 4.5 g/dL   Albumin/Globulin Ratio 1.0 (L) 1.2 - 2.2   Bilirubin Total 0.5 0.0 - 1.2 mg/dL   Alkaline Phosphatase 127 (H) 39 - 117 IU/L   AST 25 0 - 40 IU/L   ALT 15 0 - 32 IU/L  CBC with Differential/Platelet  Result Value Ref Range   WBC 12.7 (H) 3.4 - 10.8 x10E3/uL   RBC 5.38 (H) 3.77 - 5.28 x10E6/uL   Hemoglobin 15.2 11.1 - 15.9 g/dL   Hematocrit 47.9 (H) 34.0 - 46.6 %   MCV 89 79 - 97 fL   MCH 28.3 26.6 - 33.0 pg   MCHC 31.7 31.5 - 35.7 g/dL   RDW 14.5 11.7 - 15.4 %   Platelets 289 150 - 450 x10E3/uL   Neutrophils 75 Not Estab. %   Lymphs 14 Not Estab. %   Monocytes 7 Not Estab. %   Eos 2 Not Estab. %   Basos 1 Not Estab. %   Neutrophils Absolute 9.6 (H) 1.4 - 7.0 x10E3/uL  Lymphocytes Absolute 1.8 0.7 - 3.1 x10E3/uL   Monocytes Absolute 0.8 0.1 - 0.9 x10E3/uL   EOS (ABSOLUTE) 0.2 0.0 - 0.4 x10E3/uL   Basophils Absolute 0.1 0.0 - 0.2 x10E3/uL   Immature Granulocytes 1 Not Estab. %   Immature Grans (Abs) 0.1 0.0 - 0.1 x10E3/uL  Lipid panel  Result Value Ref Range   Cholesterol, Total 127 100 - 199 mg/dL   Triglycerides 86 0 - 149 mg/dL   HDL 29 (L) >39 mg/dL   VLDL Cholesterol Cal 17 5 - 40 mg/dL   LDL Calculated 81 0 - 99 mg/dL   Chol/HDL Ratio 4.4 0.0 - 4.4 ratio  TSH  Result Value Ref Range   TSH 4.870 (H) 0.450 - 4.500 uIU/mL  Bayer DCA Hb A1c Waived  Result Value Ref Range   HB A1C (BAYER DCA - WAIVED) 6.8 <7.0 %      Assessment & Plan:   1. Lumbar degenerative disc disease - NARCAN 4 MG/0.1ML LIQD nasal spray kit; CALL 911. ADMINISTER A SINGLE SPRAY OF NARCAN IN ONE NOSTRIL, REPEAT EVERY 3 MINUTES AS NEEDED IF NO OR MINIMAL RESPONSE - oxyCODONE-acetaminophen (PERCOCET) 7.5-325  MG tablet; Take 1 tablet by mouth every 6 (six) hours as needed for severe pain.  Dispense: 102 tablet; Refill: 0 - oxyCODONE-acetaminophen (PERCOCET) 7.5-325 MG tablet; Take 1 tablet by mouth every 6 (six) hours as needed for severe pain.  Dispense: 120 tablet; Refill: 0 - oxyCODONE-acetaminophen (PERCOCET) 7.5-325 MG tablet; Take 1 tablet by mouth every 6 (six) hours as needed for severe pain.  Dispense: 120 tablet; Refill: 0  2. Lumbar nerve root impingement - NARCAN 4 MG/0.1ML LIQD nasal spray kit; CALL 911. ADMINISTER A SINGLE SPRAY OF NARCAN IN ONE NOSTRIL, REPEAT EVERY 3 MINUTES AS NEEDED IF NO OR MINIMAL RESPONSE - oxyCODONE-acetaminophen (PERCOCET) 7.5-325 MG tablet; Take 1 tablet by mouth every 6 (six) hours as needed for severe pain.  Dispense: 102 tablet; Refill: 0 - oxyCODONE-acetaminophen (PERCOCET) 7.5-325 MG tablet; Take 1 tablet by mouth every 6 (six) hours as needed for severe pain.  Dispense: 120 tablet; Refill: 0 - oxyCODONE-acetaminophen (PERCOCET) 7.5-325 MG tablet; Take 1 tablet by mouth every 6 (six) hours as needed for severe pain.  Dispense: 120 tablet; Refill: 0  3. Osteophyte of vertebrae - NARCAN 4 MG/0.1ML LIQD nasal spray kit; CALL 911. ADMINISTER A SINGLE SPRAY OF NARCAN IN ONE NOSTRIL, REPEAT EVERY 3 MINUTES AS NEEDED IF NO OR MINIMAL RESPONSE - oxyCODONE-acetaminophen (PERCOCET) 7.5-325 MG tablet; Take 1 tablet by mouth every 6 (six) hours as needed for severe pain.  Dispense: 102 tablet; Refill: 0 - oxyCODONE-acetaminophen (PERCOCET) 7.5-325 MG tablet; Take 1 tablet by mouth every 6 (six) hours as needed for severe pain.  Dispense: 120 tablet; Refill: 0 - oxyCODONE-acetaminophen (PERCOCET) 7.5-325 MG tablet; Take 1 tablet by mouth every 6 (six) hours as needed for severe pain.  Dispense: 120 tablet; Refill: 0  4. Well adult exam - CMP14+EGFR - CBC with Differential/Platelet - Lipid panel - TSH - Bayer DCA Hb A1c Waived  5. Bipolar 1 disorder (HCC) -  lamoTRIgine (LAMICTAL) 25 MG tablet; Take 25 mg by mouth 2 (two) times daily. - VRAYLAR 4.5 MG CAPS; Take 1 capsule by mouth at bedtime.    Continue all other maintenance medications as listed above.  Follow up plan: Return in about 3 months (around 05/21/2019) for release all drug scrren 2019/2020 Dr Reece Levy, follow up pain meds.  Educational handout given for [employer].  Lanier Prude [place] [address]  [place], [place] [address] [phone]   02/21/2019, 2:29 PM   Consider gabapentin at next visit, then lower percocet to 7.5 mg dose  Contract on file Last UDS  10/18/18  [facility] script sent or current  History of overdose or risk of abuse no

## 2019-02-19 LAB — LIPID PANEL
Chol/HDL Ratio: 4.4 ratio (ref 0.0–4.4)
Cholesterol, Total: 127 mg/dL (ref 100–199)
HDL: 29 mg/dL — ABNORMAL LOW (ref >39)
LDL Calculated: 81 mg/dL (ref 0–99)
Triglycerides: 86 mg/dL (ref 0–149)
VLDL Cholesterol Cal: 17 mg/dL (ref 5–40)

## 2019-02-19 LAB — CMP14+EGFR
ALT: 15 IU/L (ref 0–32)
AST: 25 IU/L (ref 0–40)
Albumin/Globulin Ratio: 1 — ABNORMAL LOW (ref 1.2–2.2)
Albumin: 3.6 g/dL — ABNORMAL LOW (ref 3.8–4.9)
Alkaline Phosphatase: 127 IU/L — ABNORMAL HIGH (ref 39–117)
BUN/Creatinine Ratio: 12 (ref 9–23)
BUN: 8 mg/dL (ref 6–24)
Bilirubin Total: 0.5 mg/dL (ref 0.0–1.2)
CO2: 29 mmol/L (ref 20–29)
Calcium: 9 mg/dL (ref 8.7–10.2)
Chloride: 91 mmol/L — ABNORMAL LOW (ref 96–106)
Creatinine, Ser: 0.65 mg/dL (ref 0.57–1.00)
GFR calc Af Amer: 119 mL/min/{1.73_m2} (ref >59)
GFR calc non Af Amer: 103 mL/min/{1.73_m2} (ref >59)
Globulin, Total: 3.5 g/dL (ref 1.5–4.5)
Glucose: 119 mg/dL — ABNORMAL HIGH (ref 65–99)
Potassium: 4.1 mmol/L (ref 3.5–5.2)
Sodium: 134 mmol/L (ref 134–144)
Total Protein: 7.1 g/dL (ref 6.0–8.5)

## 2019-02-19 LAB — CBC WITH DIFFERENTIAL/PLATELET
Basophils Absolute: 0.1 10*3/uL (ref 0.0–0.2)
Basos: 1 %
EOS (ABSOLUTE): 0.2 10*3/uL (ref 0.0–0.4)
Eos: 2 %
Hematocrit: 47.9 % — ABNORMAL HIGH (ref 34.0–46.6)
Hemoglobin: 15.2 g/dL (ref 11.1–15.9)
Immature Grans (Abs): 0.1 10*3/uL (ref 0.0–0.1)
Immature Granulocytes: 1 %
Lymphocytes Absolute: 1.8 10*3/uL (ref 0.7–3.1)
Lymphs: 14 %
MCH: 28.3 pg (ref 26.6–33.0)
MCHC: 31.7 g/dL (ref 31.5–35.7)
MCV: 89 fL (ref 79–97)
Monocytes Absolute: 0.8 10*3/uL (ref 0.1–0.9)
Monocytes: 7 %
Neutrophils Absolute: 9.6 10*3/uL — ABNORMAL HIGH (ref 1.4–7.0)
Neutrophils: 75 %
Platelets: 289 10*3/uL (ref 150–450)
RBC: 5.38 x10E6/uL — ABNORMAL HIGH (ref 3.77–5.28)
RDW: 14.5 % (ref 11.7–15.4)
WBC: 12.7 10*3/uL — ABNORMAL HIGH (ref 3.4–10.8)

## 2019-02-19 LAB — TSH: TSH: 4.87 u[IU]/mL — ABNORMAL HIGH (ref 0.450–4.500)

## 2019-02-21 ENCOUNTER — Other Ambulatory Visit: Payer: Self-pay | Admitting: Physician Assistant

## 2019-02-21 ENCOUNTER — Telehealth: Payer: Self-pay | Admitting: *Deleted

## 2019-02-21 DIAGNOSIS — R252 Cramp and spasm: Secondary | ICD-10-CM

## 2019-02-21 MED ORDER — ROPINIROLE HCL 2 MG PO TABS: 2.0000 mg | ORAL_TABLET | Freq: Two times a day (BID) | ORAL | 5 refills | Status: DC

## 2019-02-21 NOTE — Telephone Encounter (Signed)
Patient aware rx sent to pharmacy.  

## 2019-02-21 NOTE — Telephone Encounter (Signed)
Aware. 

## 2019-02-21 NOTE — Telephone Encounter (Signed)
-----   Message from Terald Sleeper, PA-C sent at 02/21/2019 12:00 PM EDT ----- Your hemoglobin A1c is gone up to 6.8.  Previously it was 6.1.  Please work on your carbs in diet and plan to recheck in 3 months.  The remainder of the labs overall look very good.  Your thyroid was minimally out of range and so we will recheck that in 3 months also.  The remainder of the labs can be 6 months

## 2019-03-08 ENCOUNTER — Other Ambulatory Visit: Payer: Self-pay | Admitting: Physician Assistant

## 2019-04-16 ENCOUNTER — Other Ambulatory Visit: Payer: Self-pay | Admitting: Physician Assistant

## 2019-04-19 ENCOUNTER — Telehealth: Payer: Self-pay | Admitting: *Deleted

## 2019-04-19 NOTE — Telephone Encounter (Addendum)
Prior Auth for Oxycodone-Acetaminophen 7.5-325--APPROVED till 10/16/19   PA# [id number]  Form filled out and records faxed to [facility]

## 2019-05-18 ENCOUNTER — Other Ambulatory Visit: Payer: Self-pay | Admitting: Physician Assistant

## 2019-05-20 ENCOUNTER — Encounter: Payer: Self-pay | Admitting: Physician Assistant

## 2019-05-20 ENCOUNTER — Ambulatory Visit (INDEPENDENT_AMBULATORY_CARE_PROVIDER_SITE_OTHER): Payer: Medicaid Other | Admitting: Physician Assistant

## 2019-05-20 DIAGNOSIS — M5416 Radiculopathy, lumbar region: Secondary | ICD-10-CM

## 2019-05-20 DIAGNOSIS — M2578 Osteophyte, vertebrae: Secondary | ICD-10-CM | POA: Diagnosis not present

## 2019-05-20 DIAGNOSIS — M5136 Other intervertebral disc degeneration, lumbar region: Secondary | ICD-10-CM | POA: Diagnosis not present

## 2019-05-20 DIAGNOSIS — M51369 Other intervertebral disc degeneration, lumbar region without mention of lumbar back pain or lower extremity pain: Secondary | ICD-10-CM

## 2019-05-20 MED ORDER — GABAPENTIN 100 MG PO CAPS: 100.0000 mg | ORAL_CAPSULE | Freq: Three times a day (TID) | ORAL | 3 refills | Status: AC

## 2019-05-20 MED ORDER — FUROSEMIDE 20 MG PO TABS: 20.0000 mg | ORAL_TABLET | Freq: Two times a day (BID) | ORAL | 5 refills | Status: AC

## 2019-05-20 MED ORDER — OXYCODONE-ACETAMINOPHEN 7.5-325 MG PO TABS: 1.0000 | ORAL_TABLET | Freq: Four times a day (QID) | ORAL | 0 refills | Status: AC | PRN

## 2019-05-20 NOTE — Progress Notes (Signed)
Telephone visit  Subjective: CC: Recheck on chronic conditions PCP: Terald Sleeper, PA-C LFY:[id number] Marissa [name] is a 53 y.o. female calls for telephone consult today. Patient provides verbal consent for consult held via phone.  Patient is identified with 2 separate identifiers.  At this time the entire area is on COVID-19 social distancing and stay home orders are in place.  Patient is of higher risk and therefore we are performing this by a virtual method.  Location of patient: Home Location of provider: [facility] Others present for call: No   this patient is having a follow-up on her chronic medical conditions.  She states she has been having a lot of swelling in recent days.  Edema/hypertension.  She says her blood pressure readings have been very good.  She is currently taking Lasix once a day on a regular routine but sometimes will take it twice a day.  She is doing this at least 3 times a week.  I have asked her to keep track of her weight.  And we will plan to do this on a daily basis and plan to recheck a potassium soon.  She also continues with right neck pain and lumbar pain.  She does have known degenerative disc throughout her spine with other degenerative changes.  PAIN ASSESSMENT: Cause of pain-DDD Nerve root impingement vertebral osteophyte  2019 MRI 1. L5-S1 chronic inferiorly migrating disc extrusion with right S1 impingement. The herniation has mildly contracted since 2015. 2. L4-5 chronic inferiorly migrating disc extrusion which contacts but does not definitely compress the L5 nerve roots.  Lumbar :left lateral osteophytes at L4-5 mild narrowing at L4-S1 with spurring and facet  degenerative changes without anterolisthesis.   This patient returns for a 3 month recheck on narcotic use for the above named conditions  Current medications-percocet 10/325 1 QID for severe pain Ibuprofen Currently under psychiatry treatment and will not change her to cymbalta  at this time Medication side effects-none Any concerns-no  Pain on scale of 1-10-8 Frequency-daily What increases pain-standing What makes pain Better-rest Effects on ADL -moderate to severe some days Any change in general medical condition-no  Effectiveness of current meds-good Adverse reactions form pain meds-no PMP AWARE website reviewed:Yes Any suspicious activity on PMP Aware:No MME daily dose:45 MME Drug screen is obtained through psychiatry She is under the care of Dr Reece Levy for psychiatry  Treatment  Controlled medication contract 03/08/2019   ROS: Per HPI  Allergies  Allergen Reactions   Advera [Compleat] Itching    Throat Itching   Relpax [Eletriptan] Other (See Comments)    Weakness, couldn't move   Advair Diskus [Fluticasone-Salmeterol]     Made throat feel itchy.   Ativan [Lorazepam]    Citalopram    Paroxetine Hcl    Requip [Ropinirole Hcl] Other (See Comments)    jerking   Sertraline    Morphine And Related Itching   Past Medical History:  Diagnosis Date   Arthritis    Asthma    Atelectasis pulmonary    Back pain    CHF (congestive heart failure) ([place])    11/2016   Chronic back pain    COPD (chronic obstructive pulmonary disease) ([facility])    Depression    Depression with anxiety    Dyspnea    Emphysema lung ([facility])    Emphysema lung ([facility])    Fibromyalgia    GERD (gastroesophageal reflux disease)    Headache(784.0)    HX MIGRAINES   Hypercholesterolemia  Hypertension    Insomnia    Morbid obesity ([place])    Pneumonia    MARCH   2018   Requires supplemental oxygen    3 LITERS AT HS   Sleep apnea    BIPAP +  O2    Current Outpatient Medications:    albuterol (PROVENTIL) (2.5 MG/3ML) 0.083% nebulizer solution, Take 3 mLs (2.5 mg total) by nebulization every 6 (six) hours as needed for wheezing or shortness of breath., Disp: 240 mL, Rfl: 11   albuterol (VENTOLIN HFA) 108 (90 Base) MCG/ACT  inhaler, INHALE TWO PUFFS BY MOUTH EVERY 6 HOURS AS NEEDED, Disp: 8.5 g, Rfl: 1   ALPRAZolam (XANAX) 1 MG tablet, Take 1 mg by mouth 3 (three) times daily. , Disp: , Rfl:    cetirizine (ZYRTEC) 10 MG tablet, TAKE 1 TABLET DAILY AS NEEDED FOR ALLERGIES, Disp: 30 tablet, Rfl: 11   furosemide (LASIX) 20 MG tablet, Take 1 tablet (20 mg total) by mouth 2 (two) times daily., Disp: 60 tablet, Rfl: 5   gabapentin (NEURONTIN) 100 MG capsule, Take 1 capsule (100 mg total) by mouth 3 (three) times daily., Disp: 90 capsule, Rfl: 3   ibuprofen (ADVIL,MOTRIN) 200 MG tablet, Take 800 mg by mouth every 8 (eight) hours as needed for mild pain or moderate pain., Disp: , Rfl:    lamoTRIgine (LAMICTAL) 25 MG tablet, Take 25 mg by mouth 2 (two) times daily., Disp: , Rfl:    mometasone-formoterol (DULERA) 200-5 MCG/ACT AERO, Inhale 2 puffs into the lungs daily., Disp: 13 g, Rfl: 11   Multiple Vitamins-Minerals (MULTIVITAMIN WITH MINERALS) tablet, Take 1 tablet by mouth daily., Disp: , Rfl:    NARCAN 4 MG/0.1ML LIQD nasal spray kit, CALL 911. ADMINISTER A SINGLE SPRAY OF NARCAN IN ONE NOSTRIL, REPEAT EVERY 3 MINUTES AS NEEDED IF NO OR MINIMAL RESPONSE, Disp: , Rfl:    NON FORMULARY, Pt uses CPAP machine at bedtime, Disp: , Rfl:    ondansetron (ZOFRAN) 8 MG tablet, TAKE 1 TABLET EVERY 8 HOURS AS NEEDED FOR NAUSEA AND VOMITING, Disp: 20 tablet, Rfl: 2   oxyCODONE-acetaminophen (PERCOCET) 7.5-325 MG tablet, Take 1 tablet by mouth every 6 (six) hours as needed for severe pain., Disp: 102 tablet, Rfl: 0   oxyCODONE-acetaminophen (PERCOCET) 7.5-325 MG tablet, Take 1 tablet by mouth every 6 (six) hours as needed for severe pain., Disp: 120 tablet, Rfl: 0   oxyCODONE-acetaminophen (PERCOCET) 7.5-325 MG tablet, Take 1 tablet by mouth every 6 (six) hours as needed for severe pain., Disp: 120 tablet, Rfl: 0   OXYGEN, Inhale 3 L into the lungs at bedtime., Disp: , Rfl:    Potassium 99 MG TABS, Take 1 tablet by mouth as  needed. , Disp: , Rfl:    Vitamin D, Ergocalciferol, (DRISDOL) 50000 units CAPS capsule, Take 1 capsule (50,000 Units total) by mouth every 7 (seven) days., Disp: 4 capsule, Rfl: 11   VRAYLAR 4.5 MG CAPS, Take 1 capsule by mouth at bedtime., Disp: , Rfl:   Assessment/ Plan: 53 y.o. female   1. Lumbar degenerative disc disease - oxyCODONE-acetaminophen (PERCOCET) 7.5-325 MG tablet; Take 1 tablet by mouth every 6 (six) hours as needed for severe pain.  Dispense: 102 tablet; Refill: 0 - oxyCODONE-acetaminophen (PERCOCET) 7.5-325 MG tablet; Take 1 tablet by mouth every 6 (six) hours as needed for severe pain.  Dispense: 120 tablet; Refill: 0 - oxyCODONE-acetaminophen (PERCOCET) 7.5-325 MG tablet; Take 1 tablet by mouth every 6 (six) hours as needed for severe pain.  Dispense: 120 tablet; Refill: 0  2. Lumbar nerve root impingement - oxyCODONE-acetaminophen (PERCOCET) 7.5-325 MG tablet; Take 1 tablet by mouth every 6 (six) hours as needed for severe pain.  Dispense: 102 tablet; Refill: 0 - oxyCODONE-acetaminophen (PERCOCET) 7.5-325 MG tablet; Take 1 tablet by mouth every 6 (six) hours as needed for severe pain.  Dispense: 120 tablet; Refill: 0 - oxyCODONE-acetaminophen (PERCOCET) 7.5-325 MG tablet; Take 1 tablet by mouth every 6 (six) hours as needed for severe pain.  Dispense: 120 tablet; Refill: 0  3. Osteophyte of vertebrae - oxyCODONE-acetaminophen (PERCOCET) 7.5-325 MG tablet; Take 1 tablet by mouth every 6 (six) hours as needed for severe pain.  Dispense: 102 tablet; Refill: 0 - oxyCODONE-acetaminophen (PERCOCET) 7.5-325 MG tablet; Take 1 tablet by mouth every 6 (six) hours as needed for severe pain.  Dispense: 120 tablet; Refill: 0 - oxyCODONE-acetaminophen (PERCOCET) 7.5-325 MG tablet; Take 1 tablet by mouth every 6 (six) hours as needed for severe pain.  Dispense: 120 tablet; Refill: 0   Return in about 3 months (around 08/19/2019).  Continue all other maintenance medications as listed  above.  Start time: 11:16 AM End time: 11:31 AM  Meds ordered this encounter  Medications   furosemide (LASIX) 20 MG tablet    Sig: Take 1 tablet (20 mg total) by mouth 2 (two) times daily.    Dispense:  60 tablet    Refill:  5    Order Specific Question:   Supervising Provider    Answer:   Janora Norlander [[id number]]   oxyCODONE-acetaminophen (PERCOCET) 7.5-325 MG tablet    Sig: Take 1 tablet by mouth every 6 (six) hours as needed for severe pain.    Dispense:  102 tablet    Refill:  0    Fill 60 days from original script date    Order Specific Question:   Supervising Provider    Answer:   Janora Norlander [[id number]]   oxyCODONE-acetaminophen (PERCOCET) 7.5-325 MG tablet    Sig: Take 1 tablet by mouth every 6 (six) hours as needed for severe pain.    Dispense:  120 tablet    Refill:  0    Fill 30 days from original script date    Order Specific Question:   Supervising Provider    Answer:   Janora Norlander [[id number]]   oxyCODONE-acetaminophen (PERCOCET) 7.5-325 MG tablet    Sig: Take 1 tablet by mouth every 6 (six) hours as needed for severe pain.    Dispense:  120 tablet    Refill:  0    Order Specific Question:   Supervising Provider    Answer:   Janora Norlander [[id number]]   gabapentin (NEURONTIN) 100 MG capsule    Sig: Take 1 capsule (100 mg total) by mouth 3 (three) times daily.    Dispense:  90 capsule    Refill:  3    Order Specific Question:   Supervising Provider    Answer:   Janora Norlander [[id number]]    Particia Nearing PA-C [place] [phone]

## 2019-06-27 ENCOUNTER — Telehealth: Payer: Self-pay | Admitting: Physician Assistant

## 2019-06-27 ENCOUNTER — Ambulatory Visit (INDEPENDENT_AMBULATORY_CARE_PROVIDER_SITE_OTHER): Payer: Medicaid Other | Admitting: Family

## 2019-06-27 ENCOUNTER — Encounter: Payer: Self-pay | Admitting: Family

## 2019-06-27 DIAGNOSIS — B372 Candidiasis of skin and nail: Secondary | ICD-10-CM | POA: Diagnosis not present

## 2019-06-27 MED ORDER — NYSTATIN 100000 UNIT/GM EX POWD: Freq: Four times a day (QID) | CUTANEOUS | 4 refills | Status: AC

## 2019-06-27 MED ORDER — FLUCONAZOLE 150 MG PO TABS: 150.0000 mg | ORAL_TABLET | ORAL | 0 refills | Status: AC | PRN

## 2019-06-27 MED ORDER — NYSTATIN 100000 UNIT/GM EX OINT: 1.0000 "application " | TOPICAL_OINTMENT | Freq: Two times a day (BID) | CUTANEOUS | 2 refills | Status: AC

## 2019-06-27 NOTE — Progress Notes (Signed)
   Virtual Visit via telephone Note Due to COVID-19 pandemic this visit was conducted virtually. This visit type was conducted due to national recommendations for restrictions regarding the COVID-19 Pandemic (e.g. social distancing, sheltering in place) in an effort to limit this patient's exposure and mitigate transmission in our community. All issues noted in this document were discussed and addressed.  A physical exam was not performed with this format.  I connected with Marissa [name] on 06/27/19 at 4:10 pm by telephone and verified that I am speaking with the correct person using two identifiers. Marissa [name] is currently located at home and daughter is currently with her during visit. The provider, Evelina Dun, FNP is located in their office at time of visit.  I discussed the limitations, risks, security and privacy concerns of performing an evaluation and management service by telephone and the availability of in person appointments. I also discussed with the patient that there may be a patient responsible charge related to this service. The patient expressed understanding and agreed to proceed.   History and Present Illness:  Rash This is a recurrent problem. The current episode started 1 to 4 weeks ago. The problem has been waxing and waning since onset. Location: under bilateral breast, groin, and lower abdomen. The rash is characterized by itchiness and redness. Treatments tried: nystatin cream. The treatment provided mild relief.      Review of Systems  Skin: Positive for rash.     Observations/Objective: No SOB or distress noted   Assessment and Plan: 1. Yeast infection of the skin Keep clean and dry No not scratch Daily yogurt Call if symptoms worsen or do not improve - fluconazole (DIFLUCAN) 150 MG tablet; Take 1 tablet (150 mg total) by mouth every three (3) days as needed.  Dispense: 3 tablet; Refill: 0 - nystatin (MYCOSTATIN/NYSTOP) powder; Apply topically 4 (four)  times daily.  Dispense: 60 g; Refill: 4 - nystatin ointment (MYCOSTATIN); Apply 1 application topically 2 (two) times daily.  Dispense: 60 g; Refill: 2     I discussed the assessment and treatment plan with the patient. The patient was provided an opportunity to ask questions and all were answered. The patient agreed with the plan and demonstrated an understanding of the instructions.   The patient was advised to call back or seek an in-person evaluation if the symptoms worsen or if the condition fails to improve as anticipated.  The above assessment and management plan was discussed with the patient. The patient verbalized understanding of and has agreed to the management plan. Patient is aware to call the clinic if symptoms persist or worsen. Patient is aware when to return to the clinic for a follow-up visit. Patient educated on when it is appropriate to go to the emergency department.   Time call ended:  4:22 pm   I provided  12 minutes of non-face-to-face time during this encounter.    Evelina Dun, FNP

## 2019-07-08 ENCOUNTER — Telehealth: Payer: Self-pay | Admitting: Physician Assistant

## 2019-07-08 NOTE — Telephone Encounter (Signed)
In my recent experience with nursing home placement because of Covid, they cannot take a direct admission from the public.  All admissions have to be through hospital admission.  The best way for them to move in that direction will be to be admitted to the hospital.  Is that a possibility?  I think this would be a very good idea with the multiple issues and complaints and severity of her conditions.

## 2019-07-08 NOTE — Telephone Encounter (Signed)
Spoke to Marissa [name] and she is very concerned about her mother and her health. Mother isn't wearing her oxygen the way she is supposed to, not bathing, not hydrating, not wanting to take all of her medications properly. Her daughters feel she needs to go to a Rehab/Nursing facility. She refuses to let a nurse come in the house to help her take a bath or anything. She has multiple bed sores on her behind and last night she got up without her oxygen and was so weak she fell. They checked her 02 levels and it was in the 60's. This morning they took her to the ER at [facility] in [place]. Advised them they can call and speak to the charge nurse who is caring for her and voice these concerns as well to see if it is appropriate for her to be placed in nursing facility if she were to be admitted today. Any advice on what to do?

## 2019-07-08 NOTE — Telephone Encounter (Signed)
Pt's daughter aware of provider feedback and voiced understanding and pt is getting admitted per daughter and the pt has said she would like to go to a facility.

## 2019-07-13 IMAGING — MR MR LUMBAR SPINE W/O CM
4 of 5 series · 20 of 48 positions shown · non-contrast
Comparison: 11/10/2013

CLINICAL DATA: Chronic low back pain radiating into the bilateral
hips and legs.

EXAM:
MRI LUMBAR SPINE WITHOUT CONTRAST
TECHNIQUE: Multiplanar, multisequence MR imaging of the lumbar spine was
performed. No intravenous contrast was administered.

[Series 3: T2 · sagittal · 4.5mm · 0.55mm/px · 6 of 15 slices shown (1 of 2)]
[im 1/15]
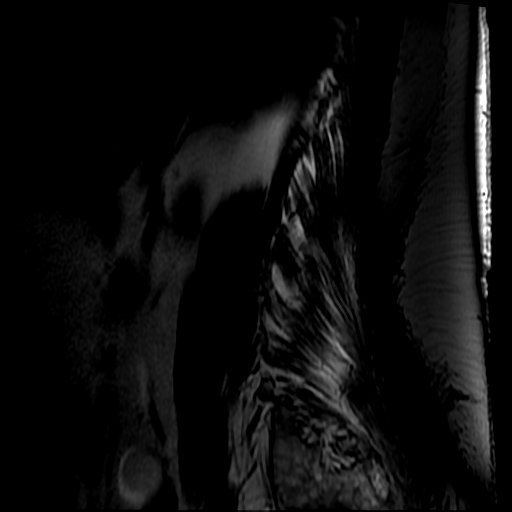
[im 3/15]
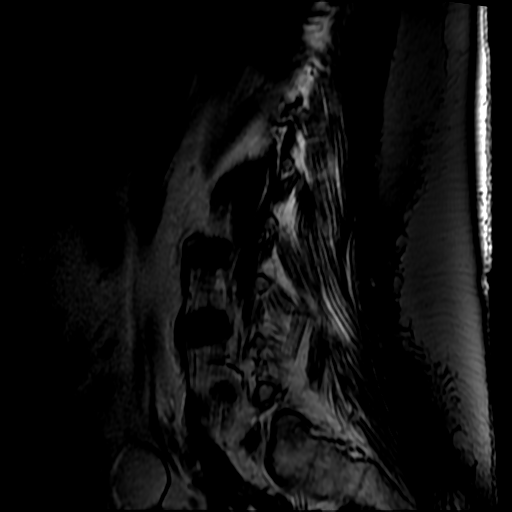
[im 6/15]
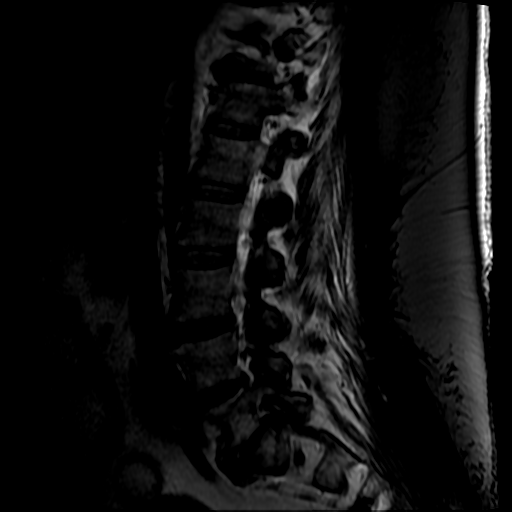
[im 9/15]
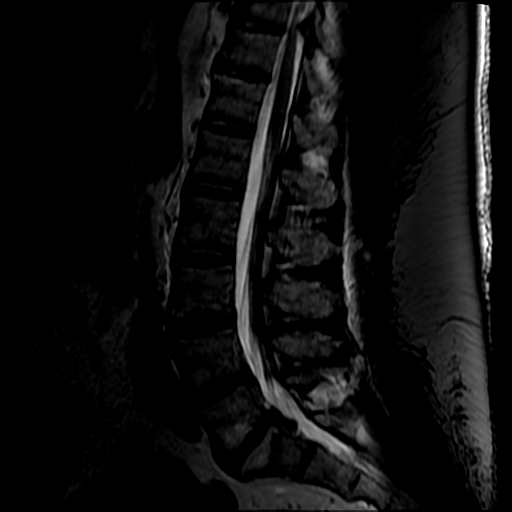
[im 12/15]
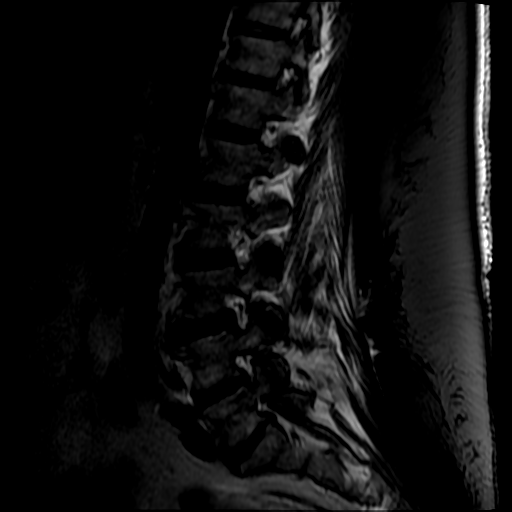
[im 15/15]
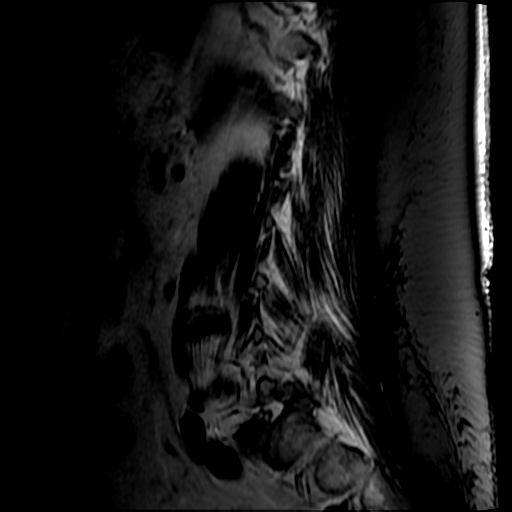

[Series 5: T1 · sagittal · 4.5mm · 0.55mm/px · 3 of 15 slices shown (1 of 2)]
[im 3/15]
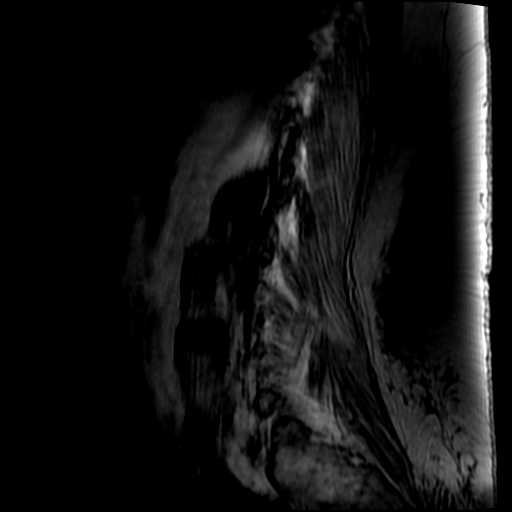
[im 9/15]
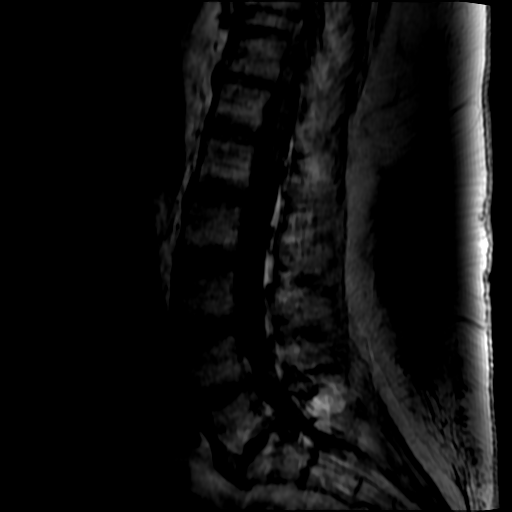
[im 15/15]
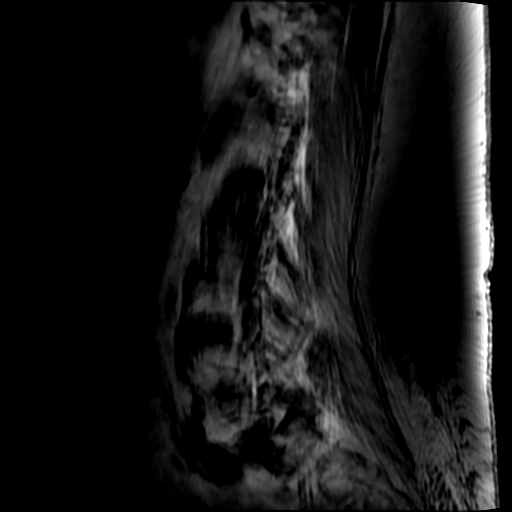

[Series 6: T2 · axial · 4.0mm · 0.43mm/px · z∈[-31,+171]mm · 8 of 41 slices shown (2 of 2)]
[im 1/41]
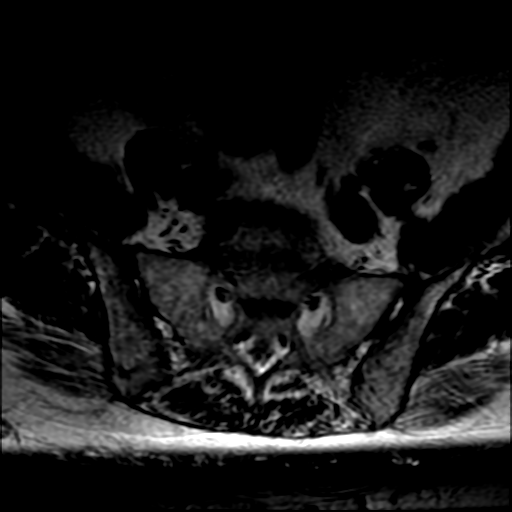
[im 6/41]
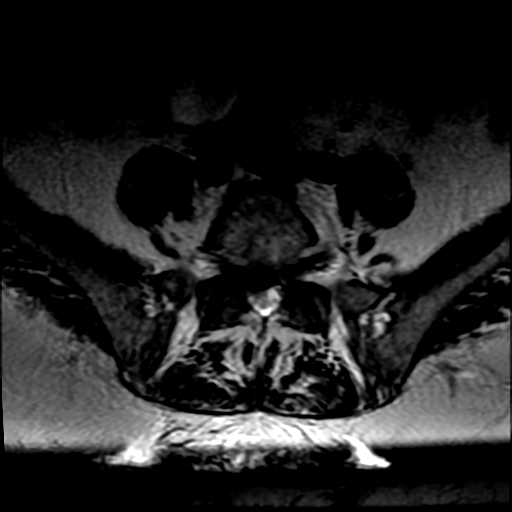
[im 12/41]
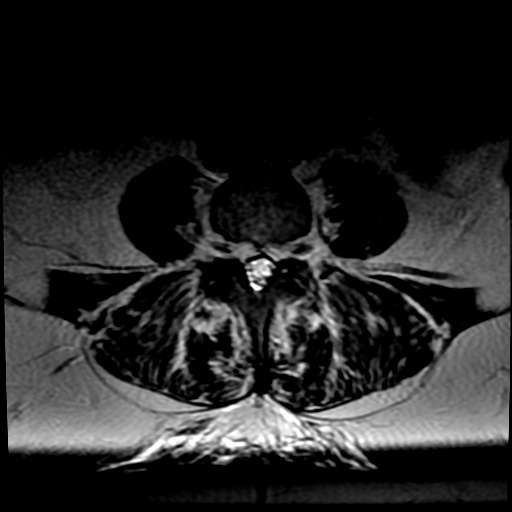
[im 18/41]
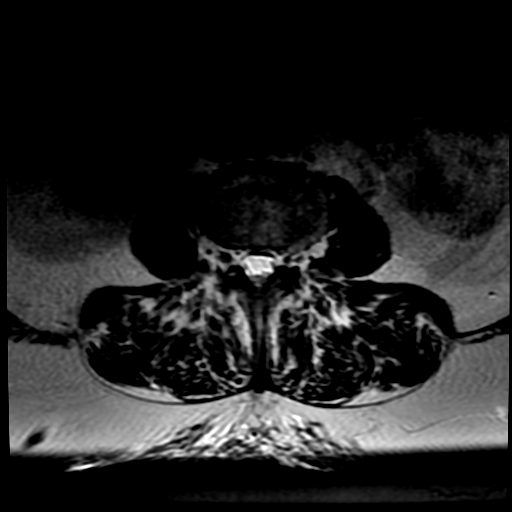
[im 21/41]
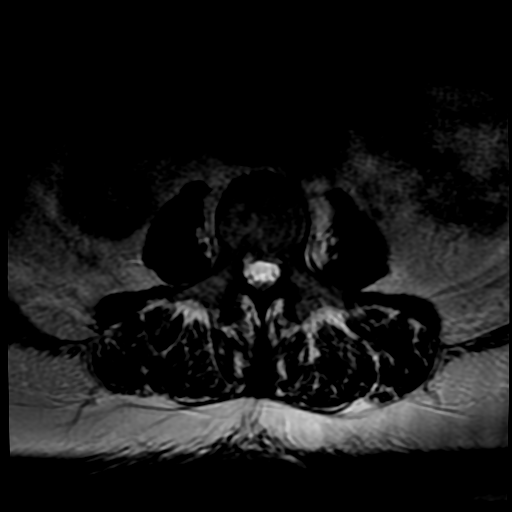
[im 23/41]
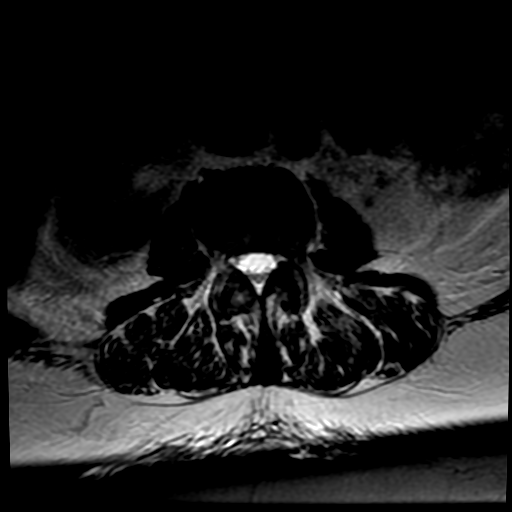
[im 29/41]
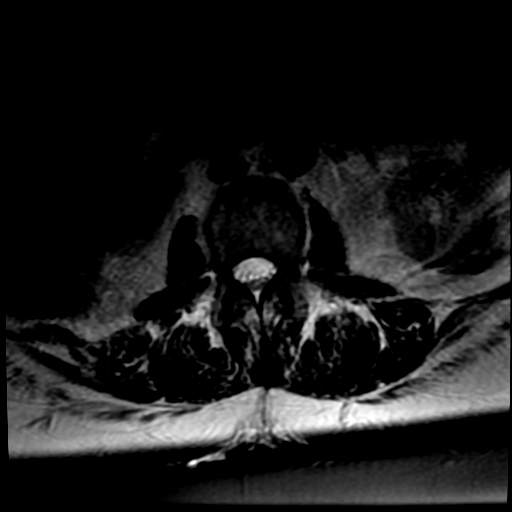
[im 35/41]
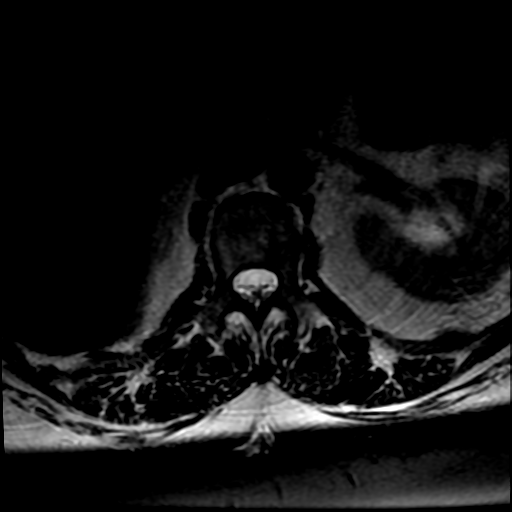

[Series 8: T1 · axial · 4.0mm · 0.86mm/px · z∈[-6,+171]mm · 3 of 41 slices shown (2 of 2)]
[im 6/41]
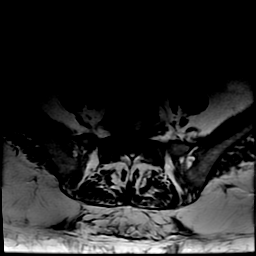
[im 21/41]
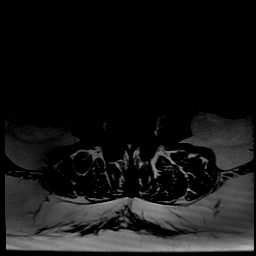
[im 35/41]
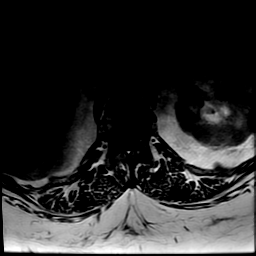

[20 of 48 positions shown; findings below may reference images not displayed]

FINDINGS: Segmentation:  Standard

Alignment:  Physiologic.

Vertebrae: Degenerative fatty endplate marrow at L5-S1 that is
progressed. No fracture, discitis, or aggressive bone lesion.

Conus medullaris and cauda equina: Conus extends to the L1 level.
Conus and cauda equina appear normal.

Paraspinal and other soft tissues: 3.7 cm right ovarian cyst. No
internal septation or nodularity on T2 weighted imaging.

Disc levels:

T10-11: Left-sided ligamentum flavum thickening without visible
impingement

T12- L1: Unremarkable.

L1-L2: Unremarkable.

L2-L3: Mild facet spurring.  No impingement

L3-L4: Disc narrowing and bulging greater towards the left. Mild
facet spurring. No impingement or progression

L4-L5: Disc narrowing with central down turning extrusion. Mild
posterior element hypertrophy. Herniation contacts the bilateral L5
nerve root without compression. The herniation is not definitively
progressed from prior.

L5-S1:Greatest level of disc narrowing and endplate degeneration
that has progressed. Chronic down turning right eccentric disc
extrusion with right S1 impingement. The herniation has mildly
contracted since prior. Bilateral foraminal narrowing that is mild
and noncompressive.

Up to moderate motion degradation.
IMPRESSION: 1. L5-S1 chronic inferiorly migrating disc extrusion with right S1
impingement. The herniation has mildly contracted since [DATE]. L4-5 chronic inferiorly migrating disc extrusion which contacts
but does not definitely compress the L5 nerve roots.
3. 3.7 cm right ovarian cyst. If premenopausal no follow-up is
required. If postmenopausal ultrasound follow-up at 6-12 months is
recommended per consensus guidelines.
4. Moderate motion degradation.

## 2019-08-17 ENCOUNTER — Ambulatory Visit: Payer: Medicaid Other | Admitting: Physician Assistant

## 2019-08-17 ENCOUNTER — Encounter: Payer: Self-pay | Admitting: Physician Assistant

## 2019-08-26 DEATH — deceased
